# Patient Record
Sex: Male | Born: 1950
Health system: Southern US, Community
[De-identification: ages and names within clinical notes are randomized; demographics above are authoritative.]

## PROBLEM LIST (undated history)

## (undated) DIAGNOSIS — I4949 Other premature depolarization: Secondary | ICD-10-CM

## (undated) DIAGNOSIS — K219 Gastro-esophageal reflux disease without esophagitis: Secondary | ICD-10-CM

## (undated) DIAGNOSIS — M1990 Unspecified osteoarthritis, unspecified site: Secondary | ICD-10-CM

## (undated) DIAGNOSIS — G4700 Insomnia, unspecified: Secondary | ICD-10-CM

## (undated) DIAGNOSIS — E782 Mixed hyperlipidemia: Secondary | ICD-10-CM

## (undated) DIAGNOSIS — N401 Enlarged prostate with lower urinary tract symptoms: Secondary | ICD-10-CM

## (undated) DIAGNOSIS — G3184 Mild cognitive impairment, so stated: Secondary | ICD-10-CM

## (undated) DIAGNOSIS — M159 Polyosteoarthritis, unspecified: Secondary | ICD-10-CM

## (undated) DIAGNOSIS — I499 Cardiac arrhythmia, unspecified: Secondary | ICD-10-CM

## (undated) DIAGNOSIS — I4891 Unspecified atrial fibrillation: Secondary | ICD-10-CM

## (undated) DIAGNOSIS — K760 Fatty (change of) liver, not elsewhere classified: Secondary | ICD-10-CM

## (undated) DIAGNOSIS — E7800 Pure hypercholesterolemia, unspecified: Secondary | ICD-10-CM

## (undated) DIAGNOSIS — K5790 Diverticulosis of intestine, part unspecified, without perforation or abscess without bleeding: Secondary | ICD-10-CM

## (undated) DIAGNOSIS — F411 Generalized anxiety disorder: Secondary | ICD-10-CM

## (undated) DIAGNOSIS — E039 Hypothyroidism, unspecified: Secondary | ICD-10-CM

## (undated) DIAGNOSIS — G8929 Other chronic pain: Secondary | ICD-10-CM

## (undated) DIAGNOSIS — Z9189 Other specified personal risk factors, not elsewhere classified: Secondary | ICD-10-CM

## (undated) DIAGNOSIS — I700 Atherosclerosis of aorta: Secondary | ICD-10-CM

## (undated) DIAGNOSIS — I2510 Atherosclerotic heart disease of native coronary artery without angina pectoris: Secondary | ICD-10-CM

## (undated) HISTORY — DX: Insomnia, unspecified: G47.00

## (undated) HISTORY — PX: ABLATION: SHX5711

## (undated) HISTORY — PX: TRANSTHORACIC ECHOCARDIOGRAM: SHX275

## (undated) HISTORY — DX: Gastro-esophageal reflux disease without esophagitis: K21.9

## (undated) HISTORY — DX: Other specified personal risk factors, not elsewhere classified: Z91.89

## (undated) HISTORY — DX: Polyosteoarthritis, unspecified: M15.9

## (undated) HISTORY — DX: Diverticulosis of intestine, part unspecified, without perforation or abscess without bleeding: K57.90

## (undated) HISTORY — DX: Cardiac arrhythmia, unspecified: I49.9

## (undated) HISTORY — DX: Atherosclerotic heart disease of native coronary artery without angina pectoris: I25.10

## (undated) HISTORY — DX: Fatty (change of) liver, not elsewhere classified: K76.0

## (undated) HISTORY — DX: Mixed hyperlipidemia: E78.2

## (undated) HISTORY — DX: Other chronic pain: G89.29

## (undated) HISTORY — DX: Pure hypercholesterolemia, unspecified: E78.00

## (undated) HISTORY — DX: Other premature depolarization: I49.49

## (undated) HISTORY — DX: Unspecified osteoarthritis, unspecified site: M19.90

## (undated) HISTORY — DX: Mild cognitive impairment of uncertain or unknown etiology: G31.84

## (undated) HISTORY — DX: Generalized anxiety disorder: F41.1

## (undated) HISTORY — DX: Atherosclerosis of aorta: I70.0

## (undated) HISTORY — DX: Hypothyroidism, unspecified: E03.9

## (undated) HISTORY — DX: Unspecified atrial fibrillation: I48.91

## (undated) HISTORY — DX: Benign prostatic hyperplasia with lower urinary tract symptoms: N40.1

## (undated) HISTORY — PX: KNEE ARTHROSCOPY: SUR90

---

## 2000-09-11 HISTORY — PX: INGUINAL HERNIA REPAIR: SUR1180

## 2004-08-03 ENCOUNTER — Ambulatory Visit: Payer: Self-pay | Admitting: Internal Medicine

## 2004-10-06 ENCOUNTER — Ambulatory Visit: Payer: Self-pay

## 2004-10-10 ENCOUNTER — Ambulatory Visit: Payer: Self-pay | Admitting: Internal Medicine

## 2004-11-09 ENCOUNTER — Ambulatory Visit: Payer: Self-pay

## 2004-11-18 ENCOUNTER — Ambulatory Visit: Payer: Self-pay | Admitting: Internal Medicine

## 2004-11-18 ENCOUNTER — Inpatient Hospital Stay (HOSPITAL_BASED_OUTPATIENT_CLINIC_OR_DEPARTMENT_OTHER): Admission: RE | Admit: 2004-11-18 | Discharge: 2004-11-18 | Payer: Self-pay | Admitting: Internal Medicine

## 2004-11-18 HISTORY — PX: CARDIAC CATHETERIZATION: SHX172

## 2004-11-28 ENCOUNTER — Ambulatory Visit: Payer: Self-pay | Admitting: *Deleted

## 2004-12-08 ENCOUNTER — Ambulatory Visit: Payer: Self-pay | Admitting: Internal Medicine

## 2005-02-03 ENCOUNTER — Ambulatory Visit: Payer: Self-pay | Admitting: Internal Medicine

## 2005-07-26 ENCOUNTER — Ambulatory Visit: Payer: Self-pay | Admitting: Internal Medicine

## 2006-04-26 ENCOUNTER — Ambulatory Visit: Payer: Self-pay | Admitting: Internal Medicine

## 2006-11-06 ENCOUNTER — Ambulatory Visit: Payer: Self-pay | Admitting: Internal Medicine

## 2007-04-30 ENCOUNTER — Ambulatory Visit: Payer: Self-pay | Admitting: Internal Medicine

## 2007-11-04 ENCOUNTER — Ambulatory Visit: Payer: Self-pay | Admitting: Internal Medicine

## 2008-02-04 ENCOUNTER — Encounter: Admission: RE | Admit: 2008-02-04 | Discharge: 2008-02-04 | Payer: Self-pay | Admitting: Orthopedic Surgery

## 2008-05-25 ENCOUNTER — Encounter: Admission: RE | Admit: 2008-05-25 | Discharge: 2008-05-25 | Payer: Self-pay | Admitting: *Deleted

## 2008-10-06 ENCOUNTER — Emergency Department (HOSPITAL_BASED_OUTPATIENT_CLINIC_OR_DEPARTMENT_OTHER): Admission: EM | Admit: 2008-10-06 | Discharge: 2008-10-06 | Payer: Self-pay | Admitting: Emergency Medicine

## 2008-11-06 ENCOUNTER — Ambulatory Visit: Payer: Self-pay | Admitting: Internal Medicine

## 2009-01-11 ENCOUNTER — Telehealth: Payer: Self-pay | Admitting: Internal Medicine

## 2009-01-12 ENCOUNTER — Encounter: Admission: RE | Admit: 2009-01-12 | Discharge: 2009-01-12 | Payer: Self-pay | Admitting: Orthopedic Surgery

## 2009-05-14 DIAGNOSIS — Z9189 Other specified personal risk factors, not elsewhere classified: Secondary | ICD-10-CM

## 2009-05-14 DIAGNOSIS — F411 Generalized anxiety disorder: Secondary | ICD-10-CM | POA: Insufficient documentation

## 2009-05-14 DIAGNOSIS — I10 Essential (primary) hypertension: Secondary | ICD-10-CM | POA: Insufficient documentation

## 2009-05-14 DIAGNOSIS — I4891 Unspecified atrial fibrillation: Secondary | ICD-10-CM | POA: Insufficient documentation

## 2009-05-14 DIAGNOSIS — I493 Ventricular premature depolarization: Secondary | ICD-10-CM | POA: Insufficient documentation

## 2009-05-14 DIAGNOSIS — I4949 Other premature depolarization: Secondary | ICD-10-CM

## 2009-05-14 HISTORY — DX: Other specified personal risk factors, not elsewhere classified: Z91.89

## 2009-05-14 HISTORY — DX: Unspecified atrial fibrillation: I48.91

## 2009-05-14 HISTORY — DX: Other premature depolarization: I49.49

## 2009-05-14 HISTORY — DX: Generalized anxiety disorder: F41.1

## 2009-05-18 ENCOUNTER — Ambulatory Visit: Payer: Self-pay | Admitting: Internal Medicine

## 2009-12-07 ENCOUNTER — Ambulatory Visit: Payer: Self-pay | Admitting: Internal Medicine

## 2010-07-19 ENCOUNTER — Ambulatory Visit: Payer: Self-pay | Admitting: Internal Medicine

## 2010-10-11 NOTE — Assessment & Plan Note (Signed)
Summary: 8 month rov/amber   Visit Type:  8 month followup  CC:  no complaints.  History of Present Illness: Mr. Hartsell is seen in followup for atrial fibrillation. He is undergone PVI x2;  He has been doing very well     His anxiety is under better control.    he also has symptomatic PVCs but those are currently under control ; he denies chest pain    Problems Prior to Update: 1)  Sleep Disorder, Hx of  (ICD-V15.89) 2)  Hypertension  (ICD-401.9) 3)  Premature Ventricular Contractions  (ICD-427.69) 4)  Anxiety  (ICD-300.00) 5)  Paroxysmal Atrial Fibrillation  (ICD-427.31)  Current Medications (verified): 1)  Crestor 10 Mg Tabs (Rosuvastatin Calcium) .... Take 1 Tablet By Mouth Once A Day 2)  Alprazolam 1 Mg Tabs (Alprazolam) .Marland Kitchen.. 1 By Mouth Once Daily At Bedtime As Needed 3)  Nortriptyline Hcl 10 Mg Caps (Nortriptyline Hcl) 4)  Aspirin 81 Mg Tbec (Aspirin) .... Take One Tablet By Mouth Daily 5)  Fish Oil 1000 Mg Caps (Omega-3 Fatty Acids) .... Once Daily 6)  Levothyroxine Sodium 50 Mcg Tabs (Levothyroxine Sodium) .... Once Daily 7)  Aspirin 81 Mg Tbec (Aspirin) .... Take One Tablet By Mouth Daily  Allergies (verified): No Known Drug Allergies  Past History:  Past Medical History: Last updated: 05/14/2009 SLEEP DISORDER, HX OF (ICD-V15.89) HYPERTENSION (ICD-401.9) PREMATURE VENTRICULAR CONTRACTIONS (ICD-427.69) ANXIETY (ICD-300.00) PAROXYSMAL ATRIAL FIBRILLATION (ICD-427.31)  Past Surgical History: Last updated: 05/14/2009 Cardiac Cath  11/18/2004  Arvilla Meres, M.D  Family History: Last updated: 05/14/2009 Negative for sudden cardiac death or history of arrhythmias. Otherwise, noncontributory  Social History: Last updated: 05/14/2009 Tobacco Use - No.  Alcohol Use - no Drug Use - no  Risk Factors: Smoking Status: never (05/14/2009)  Vital Signs:  Patient profile:   60 year old male Height:      69 inches Weight:      199.50 pounds BMI:      29.57 Pulse rate:   74 / minute BP sitting:   139 / 85  (left arm) Cuff size:   regular  Vitals Entered By: Caralee Ates CMA (July 19, 2010 9:07 AM)  Physical Exam  General:  The patient was alert and oriented in no acute distress. HEENT Normal.  Neck veins were flat, carotids were brisk.  Lungs were clear.  Heart sounds were regular without murmurs or gallops.  Abdomen was soft with active bowel sounds. There is no clubbing cyanosis or edema. Skin Warm and dry    Impression & Recommendations:  Problem # 1:  PAROXYSMAL ATRIAL FIBRILLATION (ICD-427.31) holidng sinus His updated medication list for this problem includes:    Aspirin 81 Mg Tbec (Aspirin) .Marland Kitchen... Take one tablet by mouth daily  Problem # 2:  HYPERTENSION (ICD-401.9) mildly elevated but will followup with PCP His updated medication list for this problem includes:    Aspirin 81 Mg Tbec (Aspirin) .Marland Kitchen... Take one tablet by mouth daily  Patient Instructions: 1)  Your physician recommends that you continue on your current medications as directed. Please refer to the Current Medication list given to you today. 2)  Your physician wants you to follow-up in: 1 year  You will receive a reminder letter in the mail two months in advance. If you don't receive a letter, please call our office to schedule the follow-up appointment.

## 2010-10-11 NOTE — Assessment & Plan Note (Signed)
Summary: PER CHECK OUT/SF   CC:  FOLLOW UP.  Pt says that most of the time he does pretty well unless he puts himself in a stressful position.  Marland Kitchen  History of Present Illness: Marc Benson is seen in followup for atrial fibrillation. He is undergone PVI x2; he also has symptomatic PVCs but those are currently under control ; he denies chest painarea and his major issue remains anxiety he is shortness of breath is mostly an issue only if he has significant concomitant stress. He is increasingly active socially.  Current Medications (verified): 1)  Crestor 10 Mg Tabs (Rosuvastatin Calcium) .... Take 1 Tablet By Mouth Once A Day 2)  Alprazolam 1 Mg Tabs (Alprazolam) .Marland Kitchen.. 1 By Mouth Once Daily At Bedtime As Needed 3)  Nortriptyline Hcl 10 Mg Caps (Nortriptyline Hcl) 4)  Aspirin 81 Mg Tbec (Aspirin) .... Take One Tablet By Mouth Daily 5)  Fish Oil 1000 Mg Caps (Omega-3 Fatty Acids) .... Once Daily  Allergies (verified): No Known Drug Allergies  Past History:  Past Medical History: Last updated: 05/14/2009 SLEEP DISORDER, HX OF (ICD-V15.89) HYPERTENSION (ICD-401.9) PREMATURE VENTRICULAR CONTRACTIONS (ICD-427.69) ANXIETY (ICD-300.00) PAROXYSMAL ATRIAL FIBRILLATION (ICD-427.31)  Past Surgical History: Last updated: 05/14/2009 Cardiac Cath  11/18/2004  Arvilla Meres, M.D  Family History: Last updated: 05/14/2009 Negative for sudden cardiac death or history of arrhythmias. Otherwise, noncontributory  Social History: Last updated: 05/14/2009 Tobacco Use - No.  Alcohol Use - no Drug Use - no  Vital Signs:  Patient profile:   60 year old male Height:      69 inches Weight:      201 pounds BMI:     29.79 Pulse rate:   82 / minute Pulse rhythm:   regular BP sitting:   122 / 78  (left arm) Cuff size:   large  Vitals Entered By: Marc [facility] CMA (December 07, 2009 2:09 PM)  Physical Exam  General:  The patient was alert and oriented in no acute distress. HEENT Normal.  Neck  veins were flat, carotids were brisk.  Lungs were clear.  Heart sounds were regular without murmurs or gallops.  Abdomen was soft with active bowel sounds. There is no clubbing cyanosis or edema. Skin Warm and dry    EKG  Procedure date:  12/07/2009  Findings:      sinus rhythm at 82 Intervals 0.17/0.10/0.39 Axis LXV otherwise normal  Impression & Recommendations:  Problem # 1:  PAROXYSMAL ATRIAL FIBRILLATION (ICD-427.31) holding sinus rhythm followin Pulmoonary vein isolation x2 His updated medication list for this problem includes:    Aspirin 81 Mg Tbec (Aspirin) .Marland Kitchen... Take one tablet by mouth daily  Problem # 2:  PREMATURE VENTRICULAR CONTRACTIONS (ICD-427.69) ongoing but not too disruptive His updated medication list for this problem includes:    Aspirin 81 Mg Tbec (Aspirin) .Marland Kitchen... Take one tablet by mouth daily  Orders: EKG w/ Interpretation (93000)

## 2010-10-21 ENCOUNTER — Telehealth: Payer: Self-pay | Admitting: Internal Medicine

## 2010-10-27 NOTE — Progress Notes (Signed)
Summary: pt needs to go off aspirin needs ok faxed today if poss  Pat  Phone Note From Other Clinic   Caller: Zollie Scale [phone] Summary of Call: pt having knee surgery and she needs to go off aspirin 5 days prior-pls fax to [phone] really needs today if possible Initial call taken by: Glynda Jaeger,  October 21, 2010 9:12 AM  Follow-up for Phone Call        OK to stop Asa 5 days prior to knee surgery per Dr.Brittany Osier.  Layne Benton, RN, BSN  October 21, 2010 10:36 AM Message left for [place] at above phone number that information would be faxed. This note then faxed to above fax number Follow-up by: Dossie Arbour, RN, BSN,  October 21, 2010 10:56 AM

## 2010-11-07 ENCOUNTER — Ambulatory Visit: Payer: BC Managed Care – PPO | Attending: Orthopedic Surgery | Admitting: Physical Therapy

## 2010-11-07 DIAGNOSIS — M25669 Stiffness of unspecified knee, not elsewhere classified: Secondary | ICD-10-CM | POA: Insufficient documentation

## 2010-11-07 DIAGNOSIS — M25569 Pain in unspecified knee: Secondary | ICD-10-CM | POA: Insufficient documentation

## 2010-11-07 DIAGNOSIS — IMO0001 Reserved for inherently not codable concepts without codable children: Secondary | ICD-10-CM | POA: Insufficient documentation

## 2010-11-07 DIAGNOSIS — R262 Difficulty in walking, not elsewhere classified: Secondary | ICD-10-CM | POA: Insufficient documentation

## 2011-01-24 NOTE — Assessment & Plan Note (Signed)
[facility]                         ELECTROPHYSIOLOGY OFFICE NOTE   CORDAE, MCCAREY                         MRN:          [id number]  DATE:11/06/2008                            DOB:          1950-09-16    [name] was seen at the referral of the emergency room where he  presented with palpitations, which turned out to be PVCs.  He has been  under a great deal of stress.  He thought it may be with an atrial  fibrillation, as he had had 2 pulmonary vein isolation procedures most  recently on November 7.  However, apparently the monitoring do showed  PVCs.   He has noted that he has put on a good deal of weight about 25 pounds  over the last year and half or so.  This is concurrent with him having  had Achilles tendon surgery and inability to exercise.  Concurrent with  this, there has been increasing problems with snoring, so that his wife  has been complaining of it waking him up.   MEDICATIONS:  1. Alprazolam.  2. Levothyroxine 50.  3. Nortriptyline 10.  4. Crestor 10.  5. Aspirin 81.   PHYSICAL EXAMINATION:  VITAL SIGNS:  His blood pressure was 136/90 and  his pulse was 89.  NECK:  Veins were 6 cm.  LUNGS:  Clear.  HEART:  Sounds were regular.  ABDOMEN:  Soft.  EXTREMITIES:  No edema.   Electrocardiogram dated today demonstrated sinus rhythm at 88 with  intervals of 0.15/0.10/0.37.  The axis was 55 degrees.   IMPRESSION:  1. Premature ventricular contractions.  2. Atrial fibrillation status post pulmonary vein isolation x2.  3. Hypertension.  4. Sleep-disordered breathing.   We will plan to, at this point, not undertake any medical therapy.  We  will undertake a NightWatch sleep monitors to assess for sleep apnea and  then if this is a problem, we will refer  him for further evaluation.  If this is positive treatment, it may also  have an impact on his hypertension and we are going to avoid further  medicinal therapy.     Duke Salvia, MD, [facility]  Electronically Signed    SCK/MedQ  DD: 11/06/2008  DT: 11/07/2008  Job #: [id number]   cc:   Molly Maduro L. Foy Guadalajara, M.D.

## 2011-01-24 NOTE — Assessment & Plan Note (Signed)
[facility]                         ELECTROPHYSIOLOGY OFFICE NOTE   [name], [name]                         MRN:          [id number]  DATE:04/30/2007                            DOB:          10-12-1950    [name] is seen after having had a second atrial fibrillation  ablation done by Dr. Sampson Goon last December or so.  He had a recurrent  episode of atrial fibrillation just a couple of weeks ago and Dr.  Sampson Goon would like to keep him on Coumadin for the time being and  will plan to see him again in three months' time.   I, at this point, have nothing to add.  I am glad to hear that he has  been back at work more.   His blood pressure is better controlled at this point at 110/82, his  pulse was 80.  His lungs were clear.  Heart sounds were regular.  His  weight was up a little bit at 193 pounds, which is up ten pounds in the  last year.   We will plan to see him again in six months' time.     Duke Salvia, MD, [facility]  Electronically Signed    SCK/MedQ  DD: 04/30/2007  DT: 05/01/2007  Job #: [id number]

## 2011-01-24 NOTE — Assessment & Plan Note (Signed)
[facility]                         ELECTROPHYSIOLOGY OFFICE NOTE   COLM, LYFORD                         MRN:          [id number]  DATE:11/04/2007                            DOB:          04/28/1951    Marc Benson comes in in follow-up for his atrial arrhythmias, status post  two ablation procedures at [facility].  He is having recurrent palpitations  and a Holter monitor was obtained demonstrating PVCs, PACs and short  runs of an atrial tachycardia.  He remains on Coumadin.   He is undergoing insurance changes, and he would like to consolidate his  cardiology care.  He would like to have that done here as it is simpler  than following up with [facility], at least in the absence of needing  another procedure.  I have told him I would be glad to do this.   He continues to have significant and debilitating anxiety related to his  palpitations.  I have suggested that he talk to Dr. Foy Guadalajara concerning  biofeedback and seeing if that does not help some.   On examination today, his vital signs were normal.  On examination today  his blood pressure was 124/86, his pulse was 74.  LUNGS:  Clear.  Heart sounds were regular.  EXTREMITIES:  Without edema.   His electrocardiogram demonstrated sinus rhythm with PVCs.   IMPRESSION:  1. Paroxysmal atrial fibrillation, status post ablation procedures x2.  2. Episodes of nonsustained intercurrent atrial fibrillation.  3. CHADS-2 score of 0.  4. Anxiety.  5. PVCs.   Mr. Caban is not having recurrent atrial fibrillation.  We will plan to  discontinue his Coumadin.   I have given him a prescription for Inderal 10 mg, and we will see him  again in six months' time.   He asked that I fill out his disability forms, and I did as outlined  previously.     Duke Salvia, MD, [facility]  Electronically Signed    SCK/MedQ  DD: 11/04/2007  DT: 11/05/2007  Job #: [id number]

## 2011-01-27 NOTE — Letter (Signed)
April 26, 2006     [name] Guadalajara, MD  [address]  [place], [place] [address]   RE:  CHIEF, [name]  MRN:  [id number]  /  DOB:  1951-08-18   Dear Marc Benson:   Marc [facility] came in today.  He is status post atrial fibrillation ablation  undertaken at [facility].  As you know, he has had problems with some atrial  fibrillation thereafter, but this is pretty common following ablation  procedure, and there is some time frame somewhere between three and six  months, wherein recurrent atrial fibrillation is accepted with still a  likelihood of long-term cure.   Marc Benson was singing your praises in terms of your medicinal help with his  anxiety.  This remains a debilitating issue.   He is supposed to see Dr. Sampson Goon next week.  I have asked him to review  with Dr. Sampson Goon whether he needs to be on Coumadin.  His [place] score is  0.  I think at this point, unless there is a strong indication otherwise, I  would be in favor of stopping his Coumadin.   He struggles with his Pamelor at 60; he likes it at 10.  I showed him how to  open up his capsules and get rid of half of it.  He may try that.   Will plan to see him again in six months' time.  Please let me know if there  is anything I can do in the interim.    Sincerely,      Marc Salvia, MD, [facility]   SCK/MedQ  DD:  04/26/2006  DT:  04/26/2006  Job #:  [id number]   CC:    Clydie Braun, M.D.

## 2011-01-27 NOTE — Assessment & Plan Note (Signed)
[facility]                         ELECTROPHYSIOLOGY OFFICE NOTE   Marc [facility], Marc Benson                         MRN:          [id number]  DATE:11/06/2006                            DOB:          10/25/1950    [name] is seen.  He is status post a redo PVI undertaken in  November, 2007 by Dr. Sampson Benson.  At that point, all four veins were re-  isolated.  The patient has now gone two months without recurrent  symptoms.  He comes in today with a disability form.  He is currently on  Coumadin, Crestor, and is taking no antiarrhythmics.   PHYSICAL EXAMINATION:  VITAL SIGNS:  His blood pressure is quite  elevated at 146/98.  His pulse was 95.  He does note, however, that it  was very stressful getting here in the rain.  LUNGS:  Clear.  CARDIAC:  Heart sounds were regular.  EXTREMITIES:  Without edema.   Electrocardiogram dated today demonstrated a sinus rhythm of 79 with  intervals of 0.15/0.1/0.39.   IMPRESSION:  1. Paroxysmal atrial fibrillation.  2. Hypertension today but may just be white coat.  3. Anxiety disorder.   I filled out his disability form.  We will plan to see him again in six  months.  He will be seeing Dr. Sampson Benson in about three months.     Marc Salvia, MD, [facility]  Electronically Signed    SCK/MedQ  DD: 11/06/2006  DT: 11/06/2006  Job #: [id number]   cc:   Marc Benson, M.D.  Dr. Sampson Benson at [facility]

## 2011-01-27 NOTE — Cardiovascular Report (Signed)
NAMEZACKERY, BRINE                ACCOUNT NO.:  [id number]   MEDICAL RECORD NO.:  [id number]          PATIENT TYPE:  OIB   LOCATION:  6501                         FACILITY:  [facility]   PHYSICIAN:  Arvilla Meres, M.D. LHCDATE OF BIRTH:  February 13, 1951   DATE OF PROCEDURE:  11/18/2004  DATE OF DISCHARGE:                              CARDIAC CATHETERIZATION   PRIMARY CARE PHYSICIAN:  [name] Guadalajara, M.D.  Cardiologist, Dr. Duke Salvia.  He also has a cardiologist, Dr. Shirlean Kelly, in [place].   INDICATIONS FOR PROCEDURE:  Mr. Abreu is a very pleasant, 60 year old male  with paroxysmal atrial fibrillation and hyperlipidemia who has had recurrent  chest pain for several years and is now referred for cardiac  catheterization.   PROCEDURES:  1.  Selective coronary angiography.  2.  Left heart catheterization.  3.  Left ventriculogram.   CATHETERS USED:  1.  4 French JL5.  2.  4 [place] JR4.  3.  4 French bent pigtail.   DESCRIPTION OF PROCEDURE:  The risks and benefits of catheterization were  explained to Mr. Scoggins.  Consent was signed and placed on the chart.  The  right groin area was prepped and draped in routine sterile fashion and then  anesthetized with 1% local lidocaine.  A 4 French sheath was placed in the  right femoral artery using modified Seldinger technique.  The catheters  listed above were used for the procedure.  All catheter exchanges were made  over wire.  There are no apparent complications.  At the end of the  procedure, the patient was transferred to the holding room for removal of  his vascular access.   FINDINGS:  1.  Central aortic pressure was 120/75 with a mean of 96.  2.  LV pressure was 122/4 with an EDP of 10.  There is no gradient of aortic      valve wall pullback.  3.  The left main was long and normal.  LAD was a long vessel wrapping the      apex.  It gave off two large branching diagonals.  There was no      angiographic CAD.  4.   Left circumflex was made up of tiny OM-1 and normal size OM-2.  The AV      groove circumflex was small.  There was no angiographic CAD.  5.  Right coronary artery was a dominant vessel.  It gave off a large PDA as      well as a large acute marginal which also supplied some of the PDA      territory.  There was a minor irregularity in the mid distal vessel, but      otherwise, no angiographic CAD.  6.  Left ventriculogram done in the RAO approach showed estimated ejection      fraction of about 60%.  There was no wall motion abnormalities or mitral      regurgitation.  Panning down over the abdominal aorta showed some mild      aortic plaquing, but no evidence of aneurysm.  The renal arteries  were      not visualized given the projection.   ASSESSMENT/PLAN:  1.  Normal coronary arteries.  2.  Normal left ventricular function with no evidence of significant mitral      regurgitation.  3.  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  4.  Would continue risk factor management.      DB/MEDQ  D:  11/18/2004  T:  11/18/2004  Job:  [id number]   cc:   Molly Maduro L. Foy Guadalajara, M.D.  [address] [place]  [place] [address]  Fax: [phone]   Duke Salvia, M.D.   Shirlean Kelly, M.D.  [place], [place]

## 2011-07-17 ENCOUNTER — Ambulatory Visit: Payer: BC Managed Care – PPO | Admitting: Internal Medicine

## 2011-09-19 ENCOUNTER — Encounter: Payer: Self-pay | Admitting: *Deleted

## 2011-09-19 ENCOUNTER — Encounter: Payer: Self-pay | Admitting: Internal Medicine

## 2011-09-19 ENCOUNTER — Ambulatory Visit (INDEPENDENT_AMBULATORY_CARE_PROVIDER_SITE_OTHER): Payer: BC Managed Care – PPO | Admitting: Internal Medicine

## 2011-09-19 DIAGNOSIS — I4949 Other premature depolarization: Secondary | ICD-10-CM

## 2011-09-19 DIAGNOSIS — I4891 Unspecified atrial fibrillation: Secondary | ICD-10-CM

## 2011-09-19 NOTE — Assessment & Plan Note (Signed)
Currently quiet.

## 2011-09-19 NOTE — Progress Notes (Signed)
  HPI  Marc [facility] is a 61 y.o. male Marc [facility] in followup for paroxysmal atrial fibrillation. He has undergone pulmonary vein isolation.  The patient denies chest pain, shortness of breath, nocturnal dyspnea, orthopnea or peripheral edema.  There have been no palpitations, lightheadedness or syncope.   He continues to struggle with anxiety Past Medical History  Diagnosis Date  . HYPERTENSION 05/14/2009  . PAROXYSMAL ATRIAL FIBRILLATION 05/14/2009  . PREMATURE VENTRICULAR CONTRACTIONS 05/14/2009  . ANXIETY 05/14/2009  . SLEEP DISORDER, HX OF 05/14/2009    Past Surgical History  Procedure Date  . Cardiac catheterization 11/18/2004      Current Outpatient Prescriptions  Medication Sig Dispense Refill  . ALPRAZolam (XANAX) 1 MG tablet Take 1 mg by mouth at bedtime as needed.       Marland Kitchen aspirin 81 MG tablet Take 160 mg by mouth daily.        . CRESTOR 10 MG tablet Take 10 mg by mouth daily.       . cyanocobalamin 100 MCG tablet Take 100 mcg by mouth daily.        Marland Kitchen levothyroxine (SYNTHROID, LEVOTHROID) 50 MCG tablet Take 50 mcg by mouth daily.       . nortriptyline (PAMELOR) 10 MG capsule Take 10 mg by mouth at bedtime.       . Omega-3 Fatty Acids (FISH OIL) 1000 MG CAPS Take 1 capsule by mouth daily.          Allergies  Allergen Reactions  . Benadryl (Diphenhydramine Hcl)     Review of Systems negative except from HPI and PMH  Physical Exam BP 122/84  Pulse 68  Ht 5\' 9"  (1.753 m)  Wt 198 lb 1.9 oz (89.867 kg)  BMI 29.26 kg/m2 Well developed and well nourished in no acute distress HENT normal E scleral and icterus clear Neck Supple JVP flat; carotids brisk and full Clear to ausculation  Regular rate and rhythm, no murmurs gallops or rub Soft with active bowel sounds No clubbing cyanosis none Edema Alert and oriented, grossly normal motor and sensory function Skin Warm and Dry  sItus rhythm at 68 Intervals 0.16/0.10/0.40 Axis is 68 Assessment and  Plan

## 2011-09-19 NOTE — Progress Notes (Signed)
Addended by: Sherri Rad C on: 09/19/2011 10:31 AM   Modules accepted: Orders

## 2011-09-19 NOTE — Assessment & Plan Note (Signed)
He has a CHADS-VASc score of 1-0 as he has a history of but is not treated for hypertension and his blood pressure is normal. Hence based on recent guidelines, we will discontinue his aspirin

## 2011-09-19 NOTE — Patient Instructions (Signed)
Your physician has recommended you make the following change in your medication:  1) Stop Aspirin.  Your physician wants you to follow-up in: 1 year with Dr. Klein. You will receive a reminder letter in the mail two months in advance. If you don't receive a letter, please call our office to schedule the follow-up appointment.  

## 2012-10-17 ENCOUNTER — Ambulatory Visit: Payer: BC Managed Care – PPO | Admitting: Internal Medicine

## 2012-11-05 ENCOUNTER — Ambulatory Visit: Payer: BC Managed Care – PPO | Admitting: Internal Medicine

## 2012-11-13 ENCOUNTER — Encounter: Payer: Self-pay | Admitting: Internal Medicine

## 2012-11-13 ENCOUNTER — Ambulatory Visit (INDEPENDENT_AMBULATORY_CARE_PROVIDER_SITE_OTHER): Payer: BC Managed Care – PPO | Admitting: Internal Medicine

## 2012-11-13 VITALS — BP 112/74 | HR 69 | Ht 69.0 in | Wt 187.4 lb

## 2012-11-13 DIAGNOSIS — I4891 Unspecified atrial fibrillation: Secondary | ICD-10-CM

## 2012-11-13 NOTE — Patient Instructions (Addendum)
Your physician wants you to follow-up in: 12 months with Dr. Klein. You will receive a reminder letter in the mail two months in advance. If you don't receive a letter, please call our office to schedule the follow-up appointment.  

## 2012-11-13 NOTE — Progress Notes (Signed)
  HPI  Marc Benson is a 62 y.o. male Sen in followup for paroxysmal atrial fibrillation. He has undergone pulmonary vein isolation.  The patient denies chest pain, shortness of breath, nocturnal dyspnea, orthopnea or peripheral edema.  There have been no palpitations, lightheadedness or syncope.   He continues to struggle with anxiety.    We had lengthy discussion re social justice issue.  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 HYPERTENSION 05/14/2009  . PAROXYSMAL ATRIAL FIBRILLATION 05/14/2009    s/p PVI x 2 [facility]  . PREMATURE VENTRICULAR CONTRACTIONS 05/14/2009  . ANXIETY 05/14/2009  . SLEEP DISORDER, HX OF 05/14/2009    Past Surgical History  Procedure Laterality Date  . Cardiac catheterization  11/18/2004      Current Outpatient Prescriptions  Medication Sig Dispense Refill  . ALPRAZolam (XANAX) 1 MG tablet Take 1 mg by mouth at bedtime as needed.       . CRESTOR 10 MG tablet Take 10 mg by mouth daily.       Marland Kitchen levothyroxine (SYNTHROID, LEVOTHROID) 50 MCG tablet Take 50 mcg by mouth daily.       . nortriptyline (PAMELOR) 10 MG capsule Take 10 mg by mouth at bedtime.       . cyanocobalamin 100 MCG tablet Take 100 mcg by mouth daily.        . Omega-3 Fatty Acids (FISH OIL) 1000 MG CAPS Take 1 capsule by mouth daily.         No current facility-administered medications for this visit.    Allergies  Allergen Reactions  . Benadryl (Diphenhydramine Hcl)     Review of Systems negative except from HPI and PMH  Physical Exam BP 112/74  Pulse 69  Ht 5\' 9"  (1.753 m)  Wt 187 lb 6.4 oz (85.004 kg)  BMI 27.66 kg/m2 Well developed and well nourished in no acute distress HENT normal E scleral and icterus clear Neck Supple JVP flat; carotids brisk and full Clear to ausculation  Regular rate and rhythm, no murmurs gallops or rub Soft with active bowel sounds No clubbing cyanosis none Edema Alert and  oriented, grossly normal motor and sensory function Skin Warm and Dry  ECG sinus 75 16/11/39/ +80

## 2012-11-13 NOTE — Assessment & Plan Note (Signed)
Stable

## 2013-09-11 HISTORY — PX: OTHER SURGICAL HISTORY: SHX169

## 2013-10-24 ENCOUNTER — Encounter: Payer: Self-pay | Admitting: Internal Medicine

## 2013-12-18 ENCOUNTER — Ambulatory Visit: Payer: BC Managed Care – PPO | Admitting: Internal Medicine

## 2013-12-22 ENCOUNTER — Ambulatory Visit (INDEPENDENT_AMBULATORY_CARE_PROVIDER_SITE_OTHER): Payer: BC Managed Care – PPO | Admitting: Internal Medicine

## 2013-12-22 ENCOUNTER — Encounter: Payer: Self-pay | Admitting: Internal Medicine

## 2013-12-22 VITALS — BP 124/76 | HR 76 | Ht 69.0 in | Wt 185.2 lb

## 2013-12-22 DIAGNOSIS — I4891 Unspecified atrial fibrillation: Secondary | ICD-10-CM

## 2013-12-22 DIAGNOSIS — I480 Paroxysmal atrial fibrillation: Secondary | ICD-10-CM

## 2013-12-22 NOTE — Patient Instructions (Signed)
Your physician recommends that you continue on your current medications as directed. Please refer to the Current Medication list given to you today.  Your physician wants you to follow-up in: 1 year with Dr. Klein.  You will receive a reminder letter in the mail two months in advance. If you don't receive a letter, please call our office to schedule the follow-up appointment.  

## 2013-12-22 NOTE — Progress Notes (Signed)
      Patient Care Team: Abigail Miyamoto, MD as PCP - General (Family Medicine)   HPI  Marc Benson is a 63 y.o. male Seen in followup for paroxysmal atrial fibrillation. He has undergone pulmonary vein isolation.  The patient denies chest pain, shortness of breath, nocturnal dyspnea, orthopnea or peripheral edema. There have been no palpitations, lightheadedness or syncope.  He continues to struggle with anxiety.   He has had a few episodes of atrial fibrillation. He has tolerated them quite well from a symptom point of view and an anxiety point of view. They have been short lived.  His CHADS-VASc score is 0  Past Medical History  Diagnosis Date  . HYPERTENSION 05/14/2009  . PAROXYSMAL ATRIAL FIBRILLATION 05/14/2009    s/p PVI x 2 [facility]  . PREMATURE VENTRICULAR CONTRACTIONS 05/14/2009  . ANXIETY 05/14/2009  . SLEEP DISORDER, HX OF 05/14/2009    Past Surgical History  Procedure Laterality Date  . Cardiac catheterization  11/18/2004      Current Outpatient Prescriptions  Medication Sig Dispense Refill  . ALPRAZolam (XANAX) 1 MG tablet Take 1 mg by mouth at bedtime as needed.       Marland Kitchen levothyroxine (SYNTHROID, LEVOTHROID) 50 MCG tablet Take 50 mcg by mouth daily.       . nortriptyline (PAMELOR) 10 MG capsule Take 10 mg by mouth at bedtime.       . rosuvastatin (CRESTOR) 10 MG tablet Take 10 mg by mouth daily.       No current facility-administered medications for this visit.    Allergies  Allergen Reactions  . Benadryl [Diphenhydramine Hcl]     Review of Systems negative except from HPI and PMH  Physical Exam BP 124/76  Pulse 76  Ht 5\' 9"  (1.753 m)  Wt 185 lb 3.2 oz (84.006 kg)  BMI 27.34 kg/m2 Well developed and nourished in no acute distress HENT normal Neck supple with JVP-flat Clear Regular rate and rhythm, no murmurs or gallops Abd-soft with active BS No Clubbing cyanosis edema Skin-warm and dry A & Oriented  Grossly normal sensory and motor function   Dry  ECG demonstrates sinus rhythm with frequent PACs. Intervals 15/10/38 Otherwise normal  Assessment and  Plan  Atrial fibrillation s/p PVI x2  PACs   anxiety He has had a few episodes of atrial fibrillation. He has tolerated surprisingly well. Is concern is for anticoagulation. With a CHADS-VASc score of 0 at risk would be estimated at less than 1% per year and anticoagulation even with aspirin is not recommended. He was very comforted by this.

## 2014-09-02 ENCOUNTER — Other Ambulatory Visit: Payer: Self-pay | Admitting: Orthopedic Surgery

## 2014-09-02 DIAGNOSIS — M25512 Pain in left shoulder: Secondary | ICD-10-CM

## 2014-09-08 ENCOUNTER — Ambulatory Visit
Admission: RE | Admit: 2014-09-08 | Discharge: 2014-09-08 | Disposition: A | Discharge status: Home / Self Care | Payer: BC Managed Care – PPO | Source: Ambulatory Visit | Attending: Orthopedic Surgery | Admitting: Orthopedic Surgery

## 2014-09-08 DIAGNOSIS — M25512 Pain in left shoulder: Secondary | ICD-10-CM

## 2014-09-30 HISTORY — PX: ROTATOR CUFF REPAIR: SHX139

## 2014-11-12 ENCOUNTER — Encounter: Payer: Self-pay | Admitting: Internal Medicine

## 2014-12-31 ENCOUNTER — Ambulatory Visit (INDEPENDENT_AMBULATORY_CARE_PROVIDER_SITE_OTHER): Payer: BLUE CROSS/BLUE SHIELD | Admitting: Internal Medicine

## 2014-12-31 ENCOUNTER — Encounter: Payer: Self-pay | Admitting: Internal Medicine

## 2014-12-31 VITALS — BP 124/78 | HR 68 | Ht 68.0 in | Wt 185.4 lb

## 2014-12-31 DIAGNOSIS — I480 Paroxysmal atrial fibrillation: Secondary | ICD-10-CM

## 2014-12-31 NOTE — Progress Notes (Signed)
      Patient Care Team: Briscoe Deutscher, MD as PCP - General (Family Medicine)   HPI  Marc Benson is a 64 y.o. male Seen in followup for paroxysmal atrial fibrillation. He has undergone pulmonary vein isolation.  The patient denies chest pain, shortness of breath, nocturnal dyspnea, orthopnea or peripheral edema. There have been no palpitations, lightheadedness or syncope.  He continues to struggle with anxiety. This is somewhat better.  He has had no significant atrial fibrillation of which  he is aware.  His CHADS-VASc score is 0  Past Medical History  Diagnosis Date  . HYPERTENSION 05/14/2009  . PAROXYSMAL ATRIAL FIBRILLATION 05/14/2009    s/p PVI x 2 [facility]  . PREMATURE VENTRICULAR CONTRACTIONS 05/14/2009  . ANXIETY 05/14/2009  . SLEEP DISORDER, HX OF 05/14/2009    Past Surgical History  Procedure Laterality Date  . Cardiac catheterization  11/18/2004      Current Outpatient Prescriptions  Medication Sig Dispense Refill  . ALPRAZolam (XANAX) 1 MG tablet Take 1 mg by mouth at bedtime as needed for anxiety or sleep.     Marland Kitchen levothyroxine (SYNTHROID, LEVOTHROID) 50 MCG tablet Take 50 mcg by mouth daily.     . nortriptyline (PAMELOR) 10 MG capsule Take 10 mg by mouth at bedtime.     . rosuvastatin (CRESTOR) 10 MG tablet Take 10 mg by mouth daily.     No current facility-administered medications for this visit.    Allergies  Allergen Reactions  . Benadryl [Diphenhydramine Hcl]     Review of Systems negative except from HPI and PMH  Physical Exam BP 124/78 mmHg  Pulse 68  Ht 5\' 8"  (1.727 m)  Wt 185 lb 6.4 oz (84.097 kg)  BMI 28.20 kg/m2 Well developed and nourished in no acute distress HENT normal Neck supple with JVP-flat Clear Regular rate and rhythm, no murmurs or gallops Abd-soft with active BS No Clubbing cyanosis edema Skin-warm and dry A & Oriented  Grossly normal sensory and motor function  Dry  ECG demonstrates sinus rhythm with frequent PACs. Intervals  15/10/38 Otherwise normal  Assessment and  Plan  Atrial fibrillation s/p PVI x2  PACs   anxiety   He has had no interval atrial fibrillation which  he is aware. He was told that he was having atrial fibrillation when he had his rotator cuff surgery. He was unaware of this. We do not have the ECG.

## 2014-12-31 NOTE — Patient Instructions (Signed)
Medication Instructions:  none  Labwork: none  Testing/Procedures: none  Follow-Up: Your physician wants you to follow-up in: 6 months with Dr. Gari Crown will receive a reminder letter in the mail two months in advance. If you don't receive a letter, please call our office to schedule the follow-up appointment.   Any Other Special Instructions Will Be Listed Below (If Applicable).  none

## 2015-04-20 DIAGNOSIS — Z23 Encounter for immunization: Secondary | ICD-10-CM | POA: Diagnosis not present

## 2015-04-20 DIAGNOSIS — I480 Paroxysmal atrial fibrillation: Secondary | ICD-10-CM | POA: Diagnosis not present

## 2015-04-20 DIAGNOSIS — Z79899 Other long term (current) drug therapy: Secondary | ICD-10-CM | POA: Diagnosis not present

## 2015-04-20 DIAGNOSIS — Z87891 Personal history of nicotine dependence: Secondary | ICD-10-CM | POA: Diagnosis not present

## 2015-04-20 DIAGNOSIS — E039 Hypothyroidism, unspecified: Secondary | ICD-10-CM | POA: Diagnosis not present

## 2015-04-20 DIAGNOSIS — Z1211 Encounter for screening for malignant neoplasm of colon: Secondary | ICD-10-CM | POA: Diagnosis not present

## 2015-04-20 DIAGNOSIS — Z0000 Encounter for general adult medical examination without abnormal findings: Secondary | ICD-10-CM | POA: Diagnosis not present

## 2015-04-20 DIAGNOSIS — F419 Anxiety disorder, unspecified: Secondary | ICD-10-CM | POA: Diagnosis not present

## 2015-04-20 DIAGNOSIS — E782 Mixed hyperlipidemia: Secondary | ICD-10-CM | POA: Diagnosis not present

## 2015-04-26 ENCOUNTER — Other Ambulatory Visit: Payer: Self-pay | Admitting: Family Medicine

## 2015-04-26 DIAGNOSIS — Z139 Encounter for screening, unspecified: Secondary | ICD-10-CM

## 2015-04-28 ENCOUNTER — Ambulatory Visit
Admission: RE | Admit: 2015-04-28 | Discharge: 2015-04-28 | Disposition: A | Discharge status: Home / Self Care | Payer: Medicare Other | Source: Ambulatory Visit | Attending: Family Medicine | Admitting: Family Medicine

## 2015-04-28 DIAGNOSIS — Z139 Encounter for screening, unspecified: Secondary | ICD-10-CM

## 2015-04-28 DIAGNOSIS — Z136 Encounter for screening for cardiovascular disorders: Secondary | ICD-10-CM | POA: Diagnosis not present

## 2015-04-28 DIAGNOSIS — I700 Atherosclerosis of aorta: Secondary | ICD-10-CM | POA: Diagnosis not present

## 2015-04-29 DIAGNOSIS — H01009 Unspecified blepharitis unspecified eye, unspecified eyelid: Secondary | ICD-10-CM | POA: Diagnosis not present

## 2015-04-29 DIAGNOSIS — H0289 Other specified disorders of eyelid: Secondary | ICD-10-CM | POA: Diagnosis not present

## 2015-04-29 DIAGNOSIS — H53453 Other localized visual field defect, bilateral: Secondary | ICD-10-CM | POA: Diagnosis not present

## 2015-04-29 DIAGNOSIS — H02831 Dermatochalasis of right upper eyelid: Secondary | ICD-10-CM | POA: Diagnosis not present

## 2015-06-01 DIAGNOSIS — Z23 Encounter for immunization: Secondary | ICD-10-CM | POA: Diagnosis not present

## 2015-06-23 DIAGNOSIS — M7731 Calcaneal spur, right foot: Secondary | ICD-10-CM | POA: Diagnosis not present

## 2015-06-23 DIAGNOSIS — M79671 Pain in right foot: Secondary | ICD-10-CM | POA: Diagnosis not present

## 2015-07-19 ENCOUNTER — Encounter: Payer: Self-pay | Admitting: Internal Medicine

## 2015-07-19 ENCOUNTER — Ambulatory Visit (INDEPENDENT_AMBULATORY_CARE_PROVIDER_SITE_OTHER): Payer: Medicare Other | Admitting: Internal Medicine

## 2015-07-19 VITALS — BP 124/80 | HR 72 | Ht 68.0 in | Wt 191.4 lb

## 2015-07-19 DIAGNOSIS — I493 Ventricular premature depolarization: Secondary | ICD-10-CM

## 2015-07-19 DIAGNOSIS — I480 Paroxysmal atrial fibrillation: Secondary | ICD-10-CM

## 2015-07-19 NOTE — Patient Instructions (Signed)
Medication Instructions: - no changes  Labwork: - none  Procedures/Testing: - none  Follow-Up: - Your physician wants you to follow-up in: 1 year with Dr. Klein You will receive a reminder letter in the mail two months in advance. If you don't receive a letter, please call our office to schedule the follow-up appointment.  Any Additional Special Instructions Will Be Listed Below (If Applicable).   

## 2015-07-19 NOTE — Progress Notes (Signed)
      Patient Care Team: Briscoe Deutscher, MD as PCP - General (Family Medicine)   HPI  Marc Benson is a 64 y.o. male Seen in followup for paroxysmal atrial fibrillation. He has undergone pulmonary vein isolation.  The patient denies chest pain, shortness of breath, nocturnal dyspnea, orthopnea or peripheral edema. There have been no palpitations, lightheadedness or syncope.  He continues to struggle with anxiety. This is somewhat better.  He has had no significant atrial fibrillation of which  he is aware.  His CHADS-VASc score is 0  Past Medical History  Diagnosis Date  . HYPERTENSION 05/14/2009  . PAROXYSMAL ATRIAL FIBRILLATION 05/14/2009    s/p PVI x 2 [facility]  . PREMATURE VENTRICULAR CONTRACTIONS 05/14/2009  . ANXIETY 05/14/2009  . SLEEP DISORDER, HX OF 05/14/2009    Past Surgical History  Procedure Laterality Date  . Cardiac catheterization  11/18/2004      Current Outpatient Prescriptions  Medication Sig Dispense Refill  . ALPRAZolam (XANAX) 1 MG tablet Take 1 mg by mouth at bedtime as needed for anxiety or sleep.     Marland Kitchen levothyroxine (SYNTHROID, LEVOTHROID) 50 MCG tablet Take 50 mcg by mouth daily.     . nortriptyline (PAMELOR) 10 MG capsule Take 10 mg by mouth at bedtime.     . rosuvastatin (CRESTOR) 10 MG tablet Take 10 mg by mouth daily.     No current facility-administered medications for this visit.    Allergies  Allergen Reactions  . Benadryl [Diphenhydramine Hcl]     Review of Systems negative except from HPI and PMH  Physical Exam There were no vitals taken for this visit. Well developed and nourished in no acute distress HENT normal Neck supple with JVP-flat Clear Regular rate and rhythm, no murmurs or gallops Abd-soft with active BS No Clubbing cyanosis edema Skin-warm and dry A & Oriented  Grossly normal sensory and motor function  Dry  ECG demonstrates sinus rhythm @ 72 Intervals 16/10/39 Axis 90 Otherwise normal  Assessment and   Plan  Atrial fibrillation s/p PVI x2  PACs   anxiety   palpiations quiet   Have encouraged him to pursue alternative strategies for the management of his anxiety

## 2015-08-13 DIAGNOSIS — E785 Hyperlipidemia, unspecified: Secondary | ICD-10-CM | POA: Diagnosis not present

## 2015-08-13 DIAGNOSIS — H53453 Other localized visual field defect, bilateral: Secondary | ICD-10-CM | POA: Diagnosis not present

## 2015-08-13 DIAGNOSIS — Z888 Allergy status to other drugs, medicaments and biological substances status: Secondary | ICD-10-CM | POA: Diagnosis not present

## 2015-08-13 DIAGNOSIS — H02403 Unspecified ptosis of bilateral eyelids: Secondary | ICD-10-CM | POA: Diagnosis not present

## 2015-08-13 DIAGNOSIS — Z87891 Personal history of nicotine dependence: Secondary | ICD-10-CM | POA: Diagnosis not present

## 2015-08-13 DIAGNOSIS — H0289 Other specified disorders of eyelid: Secondary | ICD-10-CM | POA: Diagnosis not present

## 2015-08-13 DIAGNOSIS — Z79899 Other long term (current) drug therapy: Secondary | ICD-10-CM | POA: Diagnosis not present

## 2015-08-13 DIAGNOSIS — E039 Hypothyroidism, unspecified: Secondary | ICD-10-CM | POA: Diagnosis not present

## 2015-08-13 DIAGNOSIS — Z91040 Latex allergy status: Secondary | ICD-10-CM | POA: Diagnosis not present

## 2015-08-13 DIAGNOSIS — H547 Unspecified visual loss: Secondary | ICD-10-CM | POA: Diagnosis not present

## 2015-08-13 DIAGNOSIS — I4891 Unspecified atrial fibrillation: Secondary | ICD-10-CM | POA: Diagnosis not present

## 2015-08-26 DIAGNOSIS — M7661 Achilles tendinitis, right leg: Secondary | ICD-10-CM | POA: Diagnosis not present

## 2015-08-26 DIAGNOSIS — M722 Plantar fascial fibromatosis: Secondary | ICD-10-CM | POA: Diagnosis not present

## 2015-09-21 DIAGNOSIS — M722 Plantar fascial fibromatosis: Secondary | ICD-10-CM | POA: Diagnosis not present

## 2015-09-21 DIAGNOSIS — M7661 Achilles tendinitis, right leg: Secondary | ICD-10-CM | POA: Diagnosis not present

## 2015-10-05 DIAGNOSIS — M24571 Contracture, right ankle: Secondary | ICD-10-CM | POA: Diagnosis not present

## 2015-10-05 DIAGNOSIS — M24572 Contracture, left ankle: Secondary | ICD-10-CM | POA: Diagnosis not present

## 2015-10-13 DIAGNOSIS — I700 Atherosclerosis of aorta: Secondary | ICD-10-CM | POA: Diagnosis not present

## 2015-10-13 DIAGNOSIS — E039 Hypothyroidism, unspecified: Secondary | ICD-10-CM | POA: Diagnosis not present

## 2015-10-13 DIAGNOSIS — E782 Mixed hyperlipidemia: Secondary | ICD-10-CM | POA: Diagnosis not present

## 2015-10-13 DIAGNOSIS — F419 Anxiety disorder, unspecified: Secondary | ICD-10-CM | POA: Diagnosis not present

## 2015-10-13 DIAGNOSIS — I480 Paroxysmal atrial fibrillation: Secondary | ICD-10-CM | POA: Diagnosis not present

## 2015-11-03 DIAGNOSIS — M24571 Contracture, right ankle: Secondary | ICD-10-CM | POA: Diagnosis not present

## 2015-11-03 DIAGNOSIS — M24572 Contracture, left ankle: Secondary | ICD-10-CM | POA: Diagnosis not present

## 2015-11-08 DIAGNOSIS — M24572 Contracture, left ankle: Secondary | ICD-10-CM | POA: Diagnosis not present

## 2015-11-08 DIAGNOSIS — M24571 Contracture, right ankle: Secondary | ICD-10-CM | POA: Diagnosis not present

## 2015-11-10 DIAGNOSIS — M24572 Contracture, left ankle: Secondary | ICD-10-CM | POA: Diagnosis not present

## 2015-11-10 DIAGNOSIS — M24571 Contracture, right ankle: Secondary | ICD-10-CM | POA: Diagnosis not present

## 2015-11-11 DIAGNOSIS — H2513 Age-related nuclear cataract, bilateral: Secondary | ICD-10-CM | POA: Diagnosis not present

## 2015-11-11 DIAGNOSIS — H1851 Endothelial corneal dystrophy: Secondary | ICD-10-CM | POA: Diagnosis not present

## 2015-11-12 DIAGNOSIS — M24571 Contracture, right ankle: Secondary | ICD-10-CM | POA: Diagnosis not present

## 2015-11-12 DIAGNOSIS — M24572 Contracture, left ankle: Secondary | ICD-10-CM | POA: Diagnosis not present

## 2015-11-15 DIAGNOSIS — M24572 Contracture, left ankle: Secondary | ICD-10-CM | POA: Diagnosis not present

## 2015-11-15 DIAGNOSIS — M24571 Contracture, right ankle: Secondary | ICD-10-CM | POA: Diagnosis not present

## 2015-11-17 DIAGNOSIS — M24572 Contracture, left ankle: Secondary | ICD-10-CM | POA: Diagnosis not present

## 2015-11-17 DIAGNOSIS — M24571 Contracture, right ankle: Secondary | ICD-10-CM | POA: Diagnosis not present

## 2015-11-19 DIAGNOSIS — M24571 Contracture, right ankle: Secondary | ICD-10-CM | POA: Diagnosis not present

## 2015-11-19 DIAGNOSIS — M24572 Contracture, left ankle: Secondary | ICD-10-CM | POA: Diagnosis not present

## 2015-12-08 DIAGNOSIS — M24571 Contracture, right ankle: Secondary | ICD-10-CM | POA: Diagnosis not present

## 2016-02-21 DIAGNOSIS — L218 Other seborrheic dermatitis: Secondary | ICD-10-CM | POA: Diagnosis not present

## 2016-02-21 DIAGNOSIS — L821 Other seborrheic keratosis: Secondary | ICD-10-CM | POA: Diagnosis not present

## 2016-02-21 DIAGNOSIS — L80 Vitiligo: Secondary | ICD-10-CM | POA: Diagnosis not present

## 2016-02-21 DIAGNOSIS — D1801 Hemangioma of skin and subcutaneous tissue: Secondary | ICD-10-CM | POA: Diagnosis not present

## 2016-04-12 ENCOUNTER — Telehealth: Payer: Self-pay | Admitting: Internal Medicine

## 2016-04-12 NOTE — Telephone Encounter (Signed)
New message       The pt has called sereval times and still has not received his medical records so he can get a life insurance policy set-up. The pt is very up-set he even states the insurance company has called.

## 2016-04-13 NOTE — Telephone Encounter (Signed)
Called spoke with patient this am-made him aware we have not received anything from his insurance [employer] [place]. He gave me the number to his agent Elmyra Ricks [phone] I called left her a VM asking her to call me back so she can fax me the signed release from the patient so we can get the process of the records started. Patient was also made aware of the process we take to get records completed. I also offered to copy the last 2 years of records for the patient if he wanted those to pick up. He stated he didn't need those for [place] to see what Elmyra Ricks ( agent) needs first.  Patient was not angry on phone and was very grateful for the call.

## 2016-04-14 DIAGNOSIS — E039 Hypothyroidism, unspecified: Secondary | ICD-10-CM | POA: Diagnosis not present

## 2016-04-14 DIAGNOSIS — I480 Paroxysmal atrial fibrillation: Secondary | ICD-10-CM | POA: Diagnosis not present

## 2016-04-14 DIAGNOSIS — E782 Mixed hyperlipidemia: Secondary | ICD-10-CM | POA: Diagnosis not present

## 2016-04-14 DIAGNOSIS — F419 Anxiety disorder, unspecified: Secondary | ICD-10-CM | POA: Diagnosis not present

## 2016-05-19 DIAGNOSIS — Z888 Allergy status to other drugs, medicaments and biological substances status: Secondary | ICD-10-CM | POA: Diagnosis not present

## 2016-05-19 DIAGNOSIS — Z87891 Personal history of nicotine dependence: Secondary | ICD-10-CM | POA: Diagnosis not present

## 2016-05-19 DIAGNOSIS — S62522B Displaced fracture of distal phalanx of left thumb, initial encounter for open fracture: Secondary | ICD-10-CM | POA: Diagnosis not present

## 2016-05-19 DIAGNOSIS — Z89012 Acquired absence of left thumb: Secondary | ICD-10-CM | POA: Diagnosis not present

## 2016-05-19 DIAGNOSIS — W231XXA Caught, crushed, jammed, or pinched between stationary objects, initial encounter: Secondary | ICD-10-CM | POA: Diagnosis not present

## 2016-05-19 DIAGNOSIS — Z79899 Other long term (current) drug therapy: Secondary | ICD-10-CM | POA: Diagnosis not present

## 2016-05-19 DIAGNOSIS — I4891 Unspecified atrial fibrillation: Secondary | ICD-10-CM | POA: Diagnosis not present

## 2016-05-19 DIAGNOSIS — E079 Disorder of thyroid, unspecified: Secondary | ICD-10-CM | POA: Diagnosis not present

## 2016-05-19 DIAGNOSIS — M79642 Pain in left hand: Secondary | ICD-10-CM | POA: Diagnosis not present

## 2016-05-19 DIAGNOSIS — Z91040 Latex allergy status: Secondary | ICD-10-CM | POA: Diagnosis not present

## 2016-05-19 DIAGNOSIS — S68521A Partial traumatic transphalangeal amputation of right thumb, initial encounter: Secondary | ICD-10-CM | POA: Diagnosis not present

## 2016-05-19 HISTORY — PX: OTHER SURGICAL HISTORY: SHX169

## 2016-05-24 DIAGNOSIS — M79645 Pain in left finger(s): Secondary | ICD-10-CM | POA: Diagnosis not present

## 2016-05-24 DIAGNOSIS — S68129A Partial traumatic metacarpophalangeal amputation of unspecified finger, initial encounter: Secondary | ICD-10-CM | POA: Diagnosis not present

## 2016-06-07 DIAGNOSIS — M79645 Pain in left finger(s): Secondary | ICD-10-CM | POA: Diagnosis not present

## 2016-06-07 DIAGNOSIS — S68129A Partial traumatic metacarpophalangeal amputation of unspecified finger, initial encounter: Secondary | ICD-10-CM | POA: Diagnosis not present

## 2016-08-01 DIAGNOSIS — Z23 Encounter for immunization: Secondary | ICD-10-CM | POA: Diagnosis not present

## 2016-08-15 DIAGNOSIS — M79606 Pain in leg, unspecified: Secondary | ICD-10-CM | POA: Diagnosis not present

## 2016-08-15 DIAGNOSIS — R229 Localized swelling, mass and lump, unspecified: Secondary | ICD-10-CM | POA: Diagnosis not present

## 2016-08-15 DIAGNOSIS — R41840 Attention and concentration deficit: Secondary | ICD-10-CM | POA: Diagnosis not present

## 2016-08-24 ENCOUNTER — Encounter: Payer: Self-pay | Admitting: Internal Medicine

## 2016-09-05 ENCOUNTER — Encounter: Payer: Self-pay | Admitting: Internal Medicine

## 2016-09-05 ENCOUNTER — Encounter (INDEPENDENT_AMBULATORY_CARE_PROVIDER_SITE_OTHER): Payer: Self-pay

## 2016-09-05 ENCOUNTER — Ambulatory Visit (INDEPENDENT_AMBULATORY_CARE_PROVIDER_SITE_OTHER): Payer: Medicare Other | Admitting: Internal Medicine

## 2016-09-05 VITALS — BP 142/78 | HR 74 | Ht 68.0 in | Wt 190.0 lb

## 2016-09-05 DIAGNOSIS — I491 Atrial premature depolarization: Secondary | ICD-10-CM | POA: Diagnosis not present

## 2016-09-05 DIAGNOSIS — I480 Paroxysmal atrial fibrillation: Secondary | ICD-10-CM

## 2016-09-05 NOTE — Progress Notes (Signed)
      Patient Care Team: Briscoe Deutscher, MD as PCP - General (Family Medicine)   HPI  Marc Benson is a 65 y.o. male Seen in followup for paroxysmal atrial fibrillation. He has undergone pulmonary vein isolation.  The patient denies chest pain, shortness of breath, nocturnal dyspnea, orthopnea or peripheral edema. There have been no palpitations, lightheadedness or syncope.  He continues to struggle with anxiety. This is somewhat better.  He has had no significant atrial fibrillation of which  he is aware.  His CHADS-VASc score is 1  Past Medical History:  Diagnosis Date  . ANXIETY 05/14/2009  . HYPERTENSION 05/14/2009  . PAROXYSMAL ATRIAL FIBRILLATION 05/14/2009   s/p PVI x 2 [facility]  . PREMATURE VENTRICULAR CONTRACTIONS 05/14/2009  . SLEEP DISORDER, HX OF 05/14/2009    Past Surgical History:  Procedure Laterality Date  . CARDIAC CATHETERIZATION  11/18/2004      Current Outpatient Prescriptions  Medication Sig Dispense Refill  . ALPRAZolam (XANAX) 1 MG tablet Take 1 mg by mouth at bedtime as needed for anxiety or sleep.     Marland Kitchen levothyroxine (SYNTHROID, LEVOTHROID) 50 MCG tablet Take 50 mcg by mouth daily.     . nortriptyline (PAMELOR) 10 MG capsule Take 10 mg by mouth at bedtime.     . rosuvastatin (CRESTOR) 10 MG tablet Take 10 mg by mouth daily.     No current facility-administered medications for this visit.     Allergies  Allergen Reactions  . Latex Itching and Rash    "LATEX TAPE"  . Benadryl [Diphenhydramine Hcl]     Review of Systems negative except from HPI and PMH  Physical Exam BP (!) 142/78   Pulse 74   Ht 5\' 8"  (1.727 m)   Wt 190 lb (86.2 kg)   BMI 28.89 kg/m  Well developed and nourished in no acute distress HENT normal Neck supple with JVP-flat Clear Regular rate and rhythm, no murmurs or gallops Abd-soft with active BS No Clubbing cyanosis edema Skin-warm and dry A & Oriented  Grossly normal sensory and motor function  Dry  ECG demonstrates  sinus rhythm @ 74 Inter101 Otherwise normal  Assessment and  Plan  Atrial fibrillation s/p PVI x2  PACs -frequent  Anxiety  Elevated blood Pressure   palpiations quiet   Keep track of BP at home

## 2016-09-05 NOTE — Patient Instructions (Signed)
Medication Instructions: - Your physician recommends that you continue on your current medications as directed. Please refer to the Current Medication list given to you today  Labwork: - none ordered  Procedures/Testing: - none ordered  Follow-Up: - Your physician wants you to follow-up in: 1 year with Amber Seiler, NP for Dr. Klein. You will receive a reminder letter in the mail two months in advance. If you don't receive a letter, please call our office to schedule the follow-up appointment.   Any Additional Special Instructions Will Be Listed Below (If Applicable).     If you need a refill on your cardiac medications before your next appointment, please call your pharmacy.   

## 2016-12-06 DIAGNOSIS — H1851 Endothelial corneal dystrophy: Secondary | ICD-10-CM | POA: Diagnosis not present

## 2016-12-06 DIAGNOSIS — H2513 Age-related nuclear cataract, bilateral: Secondary | ICD-10-CM | POA: Diagnosis not present

## 2017-01-09 DIAGNOSIS — H9191 Unspecified hearing loss, right ear: Secondary | ICD-10-CM | POA: Diagnosis not present

## 2017-01-09 DIAGNOSIS — H6121 Impacted cerumen, right ear: Secondary | ICD-10-CM | POA: Diagnosis not present

## 2017-01-27 DIAGNOSIS — S2341XA Sprain of ribs, initial encounter: Secondary | ICD-10-CM | POA: Diagnosis not present

## 2017-02-14 ENCOUNTER — Emergency Department (HOSPITAL_BASED_OUTPATIENT_CLINIC_OR_DEPARTMENT_OTHER): Payer: Medicare Other

## 2017-02-14 ENCOUNTER — Encounter (HOSPITAL_BASED_OUTPATIENT_CLINIC_OR_DEPARTMENT_OTHER): Payer: Self-pay

## 2017-02-14 ENCOUNTER — Emergency Department (HOSPITAL_BASED_OUTPATIENT_CLINIC_OR_DEPARTMENT_OTHER)
Admission: EM | Admit: 2017-02-14 | Discharge: 2017-02-14 | Disposition: A | Discharge status: Home / Self Care | Payer: Medicare Other | Attending: Emergency Medicine | Admitting: Emergency Medicine

## 2017-02-14 DIAGNOSIS — K5730 Diverticulosis of large intestine without perforation or abscess without bleeding: Secondary | ICD-10-CM | POA: Diagnosis not present

## 2017-02-14 DIAGNOSIS — Z87891 Personal history of nicotine dependence: Secondary | ICD-10-CM | POA: Diagnosis not present

## 2017-02-14 DIAGNOSIS — Z91040 Latex allergy status: Secondary | ICD-10-CM | POA: Insufficient documentation

## 2017-02-14 DIAGNOSIS — Z79899 Other long term (current) drug therapy: Secondary | ICD-10-CM | POA: Diagnosis not present

## 2017-02-14 DIAGNOSIS — N132 Hydronephrosis with renal and ureteral calculous obstruction: Secondary | ICD-10-CM | POA: Diagnosis not present

## 2017-02-14 DIAGNOSIS — R1030 Lower abdominal pain, unspecified: Secondary | ICD-10-CM | POA: Diagnosis present

## 2017-02-14 DIAGNOSIS — N201 Calculus of ureter: Secondary | ICD-10-CM | POA: Diagnosis not present

## 2017-02-14 LAB — COMPREHENSIVE METABOLIC PANEL
ALK PHOS: 56 U/L (ref 38–126)
ALT: 20 U/L (ref 17–63)
AST: 29 U/L (ref 15–41)
Albumin: 4.4 g/dL (ref 3.5–5.0)
Anion gap: 10 (ref 5–15)
BILIRUBIN TOTAL: 0.9 mg/dL (ref 0.3–1.2)
BUN: 18 mg/dL (ref 6–20)
CALCIUM: 9.2 mg/dL (ref 8.9–10.3)
CO2: 25 mmol/L (ref 22–32)
CREATININE: 1.16 mg/dL (ref 0.61–1.24)
Chloride: 102 mmol/L (ref 101–111)
GFR calc non Af Amer: >60 mL/min (ref >60)
GLUCOSE: 109 mg/dL — AB (ref 65–99)
Potassium: 3.7 mmol/L (ref 3.5–5.1)
SODIUM: 137 mmol/L (ref 135–145)
TOTAL PROTEIN: 7.3 g/dL (ref 6.5–8.1)

## 2017-02-14 LAB — CBC WITH DIFFERENTIAL/PLATELET
Basophils Absolute: 0 10*3/uL (ref 0.0–0.1)
Basophils Relative: 0 %
EOS ABS: 0.2 10*3/uL (ref 0.0–0.7)
Eosinophils Relative: 2 %
HEMATOCRIT: 41.1 % (ref 39.0–52.0)
HEMOGLOBIN: 14.2 g/dL (ref 13.0–17.0)
LYMPHS ABS: 1.6 10*3/uL (ref 0.7–4.0)
LYMPHS PCT: 15 %
MCH: 32.4 pg (ref 26.0–34.0)
MCHC: 34.5 g/dL (ref 30.0–36.0)
MCV: 93.8 fL (ref 78.0–100.0)
MONOS PCT: 8 %
Monocytes Absolute: 0.9 10*3/uL (ref 0.1–1.0)
NEUTROS ABS: 7.9 10*3/uL — AB (ref 1.7–7.7)
NEUTROS PCT: 75 %
Platelets: 225 10*3/uL (ref 150–400)
RBC: 4.38 MIL/uL (ref 4.22–5.81)
RDW: 12.9 % (ref 11.5–15.5)
WBC: 10.6 10*3/uL — AB (ref 4.0–10.5)

## 2017-02-14 LAB — URINALYSIS, ROUTINE W REFLEX MICROSCOPIC
Bilirubin Urine: NEGATIVE
Glucose, UA: NEGATIVE mg/dL
Ketones, ur: NEGATIVE mg/dL
NITRITE: NEGATIVE
PH: 6.5 (ref 5.0–8.0)
Protein, ur: NEGATIVE mg/dL
SPECIFIC GRAVITY, URINE: 1.019 (ref 1.005–1.030)

## 2017-02-14 LAB — URINALYSIS, MICROSCOPIC (REFLEX)

## 2017-02-14 MED ORDER — ONDANSETRON 4 MG PO TBDP
4.0000 mg | ORAL_TABLET | Freq: Once | ORAL | Status: DC
  Filled 2017-02-14: qty 1

## 2017-02-14 MED ORDER — ONDANSETRON 4 MG PO TBDP
4.0000 mg | ORAL_TABLET | Freq: Three times a day (TID) | ORAL | 1 refills | Status: DC | PRN
Start: 2017-02-14 — End: 2017-09-19

## 2017-02-14 MED ORDER — ONDANSETRON HCL 4 MG/2ML IJ SOLN
4.0000 mg | Freq: Once | INTRAMUSCULAR | Status: DC
  Filled 2017-02-14: qty 2

## 2017-02-14 MED ORDER — SODIUM CHLORIDE 0.9 % IV SOLN
INTRAVENOUS | Status: DC
  Administered 2017-02-14: 16:00:00 via INTRAVENOUS

## 2017-02-14 MED FILL — ONDANSETRON ODT 4 MG TABLET: 4 | 4 days supply | Qty: 10 | Fill #0

## 2017-02-14 NOTE — Discharge Instructions (Signed)
CT scan shows a right-sided 1 mm kidney stone. Follow-up with urology. Take pain medicine you have at home as needed. Take the Zofran prescribed here for nausea and vomiting. Return for any new or worse symptoms.

## 2017-02-14 NOTE — ED Provider Notes (Signed)
[facility] Provider Note   CSN: [id number] Arrival date & time: 02/14/17  1320     History   Chief Complaint Chief Complaint  Patient presents with  . Abdominal Pain    HPI Marc Benson is a 66 y.o. male.  Patient with complaint of suprapubic abdominal pain intermittently for the past 3 days. More severe today. Had it for a brief period time on Monday and then more severe today associated with nausea onset today was 11 AM. No vomiting no fevers. No blood in the urine. Does have a feeling of discomfort with urination and feeling like he needs to go when he doesn't need to. Denies any back pain or flank pain. No history of kidney stones.      Past Medical History:  Diagnosis Date  . ANXIETY 05/14/2009  . PAROXYSMAL ATRIAL FIBRILLATION 05/14/2009   s/p PVI x 2 [facility]  . PREMATURE VENTRICULAR CONTRACTIONS 05/14/2009  . SLEEP DISORDER, HX OF 05/14/2009    Patient Active Problem List   Diagnosis Date Noted  . ANXIETY 05/14/2009  . HYPERTENSION 05/14/2009  . PAROXYSMAL ATRIAL FIBRILLATION 05/14/2009  . PREMATURE VENTRICULAR CONTRACTIONS 05/14/2009  . SLEEP DISORDER, HX OF 05/14/2009    Past Surgical History:  Procedure Laterality Date  . ABLATION    . CARDIAC CATHETERIZATION  11/18/2004         Home Medications    Prior to Admission medications   Medication Sig Start Date End Date Taking? Authorizing Provider  ALPRAZolam Duanne Moron) 1 MG tablet Take 1 mg by mouth at bedtime as needed for anxiety or sleep.  08/28/11   [provider]  levothyroxine (SYNTHROID, LEVOTHROID) 50 MCG tablet Take 50 mcg by mouth daily.  08/28/11   [provider]  nortriptyline (PAMELOR) 10 MG capsule Take 10 mg by mouth at bedtime.  08/28/11   [provider]  ondansetron (ZOFRAN ODT) 4 MG disintegrating tablet Take 1 tablet (4 mg total) by mouth every 8 (eight) hours as needed for nausea or vomiting. 02/14/17   Fredia Sorrow, MD  rosuvastatin (CRESTOR) 10 MG  tablet Take 10 mg by mouth daily.    [provider]    Family History Family History  Problem Relation Age of Onset  . Aneurysm Mother   . Heart attack Father     Social History Social History  Substance Use Topics  . Smoking status: Former Smoker    Quit date: 09/19/1971  . Smokeless tobacco: Never Used  . Alcohol use No     Allergies   Latex and Benadryl [diphenhydramine hcl]   Review of Systems Review of Systems  Constitutional: Negative for fever.  HENT: Negative for congestion.   Eyes: Negative for redness.  Respiratory: Negative for shortness of breath.   Cardiovascular: Negative for chest pain.  Gastrointestinal: Positive for abdominal pain.  Genitourinary: Positive for dysuria and urgency. Negative for flank pain.  Musculoskeletal: Negative for back pain.  Skin: Negative for rash.  Neurological: Negative for headaches.  Hematological: Does not bruise/bleed easily.  Psychiatric/Behavioral: Negative for confusion.     Physical Exam Updated Vital Signs BP (!) 145/89 (BP Location: Right Arm)   Pulse 67   Temp 98.4 F (36.9 C) (Oral)   Resp 16   Ht 1.727 m (5\' 8" )   Wt 86.2 kg (190 lb)   SpO2 97%   BMI 28.89 kg/m   Physical Exam  Constitutional: He is oriented to person, place, and time. He appears well-developed and well-nourished. No  distress.  HENT:  Head: Normocephalic and atraumatic.  Mouth/Throat: Oropharynx is clear and moist.  Eyes: Conjunctivae and EOM are normal. Pupils are equal, round, and reactive to light.  Neck: Normal range of motion. Neck supple.  Cardiovascular: Normal rate, regular rhythm and normal heart sounds.   Pulmonary/Chest: Effort normal and breath sounds normal.  Abdominal: Soft. Bowel sounds are normal. There is no tenderness.  Musculoskeletal: Normal range of motion.  Neurological: He is alert and oriented to person, place, and time. No cranial nerve deficit or sensory deficit. He exhibits normal muscle tone.  Coordination normal.  Skin: Skin is warm.  Nursing note and vitals reviewed.    ED Treatments / Results  Labs (all labs ordered are listed, but only abnormal results are displayed) Labs Reviewed  URINALYSIS, ROUTINE W REFLEX MICROSCOPIC - Abnormal; Notable for the following:       Result Value   Hgb urine dipstick MODERATE (*)    Leukocytes, UA TRACE (*)    All other components within normal limits  CBC WITH DIFFERENTIAL/PLATELET - Abnormal; Notable for the following:    WBC 10.6 (*)    Neutro Abs 7.9 (*)    All other components within normal limits  COMPREHENSIVE METABOLIC PANEL - Abnormal; Notable for the following:    Glucose, Bld 109 (*)    All other components within normal limits  URINALYSIS, MICROSCOPIC (REFLEX) - Abnormal; Notable for the following:    Bacteria, UA FEW (*)    Squamous Epithelial / LPF 0-5 (*)    All other components within normal limits    EKG  EKG Interpretation None       Radiology Ct Renal Stone Study  Result Date: 02/14/2017 CLINICAL DATA:  Pain in lower abd since 11am, some nausea. Had same pain on Monday. EXAM: CT ABDOMEN AND PELVIS WITHOUT CONTRAST TECHNIQUE: Multidetector CT imaging of the abdomen and pelvis was performed following the standard protocol without IV contrast. COMPARISON:  None. FINDINGS: Lower chest: Scattered coronary calcifications. No pleural effusion. Hepatobiliary: No focal liver abnormality is seen. No gallstones, gallbladder wall thickening, or biliary dilatation. Pancreas: Unremarkable. No pancreatic ductal dilatation or surrounding inflammatory changes. Spleen: Normal in size without focal abnormality. Adrenals/Urinary Tract: Normal adrenal glands.  Normal left kidney. Inflammatory/edematous changes around the right kidney. Right hydronephrosis and ureterectasis. There is a 1 mm calculus in the distal ureter just proximal to the ureteral orifice. Urinary bladder is nondistended, unremarkable. Stomach/Bowel: Stomach, small  bowel, and colon are nondilated. Normal appendix. Scattered diverticula most numerous in the distal descending and sigmoid portions, without significant adjacent inflammatory/ edematous change or abscess. Vascular/Lymphatic: Moderate aortoiliac arterial calcifications without aneurysm. No definite adenopathy. Reproductive: Moderate prostatic enlargement with central coarse calcifications. Metallic clips in the scrotum. Other: No ascites.  No free air. Musculoskeletal: Mild spondylitic changes in the visualized lower thoracic and lumbar spine. Negative for fracture or worrisome bone lesion. IMPRESSION: 1. 1 mm distal right ureteral calculus with hydronephrosis and ureterectasis. 2. Coronary and aortoiliac arterial calcifications. 3. Descending and sigmoid colon diverticulosis. Electronically Signed   By: Lucrezia Europe M.D.   On: 02/14/2017 16:42    Procedures Procedures (including critical care time)  Medications Ordered in ED Medications  ondansetron (ZOFRAN) injection 4 mg (4 mg Intravenous Not Given 02/14/17 1537)  0.9 %  sodium chloride infusion ( Intravenous Stopped 02/14/17 1720)  ondansetron (ZOFRAN-ODT) disintegrating tablet 4 mg (4 mg Oral Not Given 02/14/17 1705)     Initial Impression / Assessment and Plan /  ED Course  I have reviewed the triage vital signs and the nursing notes.  Pertinent labs & imaging results that were available during my care of the patient were reviewed by me and considered in my medical decision making (see chart for details).     CT shows evidence of a right sided 1 mm ureteral stone. No complicating factors. Renal function is normal. Patient nontoxic no acute distress. Will give patient follow-up with [facility]. Patient has pain medicine at home. We'll provide Zofran for the nausea. Patient return for any new or worse symptoms.  Final Clinical Impressions(s) / ED Diagnoses   Final diagnoses:  Right ureteral stone    New Prescriptions New Prescriptions     ONDANSETRON (ZOFRAN ODT) 4 MG DISINTEGRATING TABLET    Take 1 tablet (4 mg total) by mouth every 8 (eight) hours as needed for nausea or vomiting.     Fredia Sorrow, MD 02/14/17 1726

## 2017-02-14 NOTE — ED Triage Notes (Signed)
C/o abd pain, nausea, dysuria x 3 days-presents to triage in w/c-grimacing

## 2017-03-01 DIAGNOSIS — L578 Other skin changes due to chronic exposure to nonionizing radiation: Secondary | ICD-10-CM | POA: Diagnosis not present

## 2017-03-01 DIAGNOSIS — D2372 Other benign neoplasm of skin of left lower limb, including hip: Secondary | ICD-10-CM | POA: Diagnosis not present

## 2017-03-01 DIAGNOSIS — L812 Freckles: Secondary | ICD-10-CM | POA: Diagnosis not present

## 2017-03-01 DIAGNOSIS — L821 Other seborrheic keratosis: Secondary | ICD-10-CM | POA: Diagnosis not present

## 2017-03-01 DIAGNOSIS — D1801 Hemangioma of skin and subcutaneous tissue: Secondary | ICD-10-CM | POA: Diagnosis not present

## 2017-03-01 DIAGNOSIS — L218 Other seborrheic dermatitis: Secondary | ICD-10-CM | POA: Diagnosis not present

## 2017-03-20 ENCOUNTER — Telehealth: Payer: Self-pay | Admitting: Internal Medicine

## 2017-03-20 NOTE — Telephone Encounter (Signed)
Received call from patient who is reporting he has been having chest tightness and heaviness that is new to him.  He has been having it for appr 1 week.  He notices it more at rest and feels like he needs to take a deep breath but that doesn't lead to relief.  He has experienced a sharp pain in his left arm pit from time to time.  He has a Hx of At Fib but he states this doesn't feel anything like that.  Advised to report to the closest ED for further evaluation.  Pt is in agreement.

## 2017-03-20 NOTE — Telephone Encounter (Signed)
Per pt call some chest tightness and it is concerning. Some irregular heart beats, Pt would like to be seen.

## 2017-04-19 DIAGNOSIS — Z0000 Encounter for general adult medical examination without abnormal findings: Secondary | ICD-10-CM | POA: Diagnosis not present

## 2017-04-19 DIAGNOSIS — I480 Paroxysmal atrial fibrillation: Secondary | ICD-10-CM | POA: Diagnosis not present

## 2017-04-19 DIAGNOSIS — E782 Mixed hyperlipidemia: Secondary | ICD-10-CM | POA: Diagnosis not present

## 2017-04-19 DIAGNOSIS — Z1211 Encounter for screening for malignant neoplasm of colon: Secondary | ICD-10-CM | POA: Diagnosis not present

## 2017-04-19 DIAGNOSIS — I700 Atherosclerosis of aorta: Secondary | ICD-10-CM | POA: Diagnosis not present

## 2017-04-19 DIAGNOSIS — F419 Anxiety disorder, unspecified: Secondary | ICD-10-CM | POA: Diagnosis not present

## 2017-04-19 DIAGNOSIS — E039 Hypothyroidism, unspecified: Secondary | ICD-10-CM | POA: Diagnosis not present

## 2017-06-27 DIAGNOSIS — Z23 Encounter for immunization: Secondary | ICD-10-CM | POA: Diagnosis not present

## 2017-09-19 ENCOUNTER — Ambulatory Visit (INDEPENDENT_AMBULATORY_CARE_PROVIDER_SITE_OTHER): Payer: Medicare Other | Admitting: Internal Medicine

## 2017-09-19 VITALS — BP 128/70 | HR 68 | Ht 68.5 in | Wt 194.0 lb

## 2017-09-19 DIAGNOSIS — I480 Paroxysmal atrial fibrillation: Secondary | ICD-10-CM | POA: Diagnosis not present

## 2017-09-19 NOTE — Patient Instructions (Signed)
Medication Instructions:  Your physician recommends that you continue on your current medications as directed. Please refer to the Current Medication list given to you today.  * If you need a refill on your cardiac medications before your next appointment, please call your pharmacy. *  Labwork: None ordered  Testing/Procedures: None ordered  Follow-Up: Your physician wants you to follow-up in: 1 year with Chanetta Marshall, NP.  You will receive a reminder letter in the mail two months in advance. If you don't receive a letter, please call our office to schedule the follow-up appointment.  Thank you for choosing [facility]!!

## 2017-09-19 NOTE — Progress Notes (Signed)
      Patient Care Team: [name], DO as PCP - General (Family Medicine)   HPI  Marc Benson is a 67 y.o. male Seen in followup for paroxysmal atrial fibrillation. He has undergone pulmonary vein isolation.  X 2   No interval afib of which he is aware,  Recurrent 10-20 sec runs of tachypalpitations unassoc with LH or DSOB.  Very active physically  No edema  Anxiety under pretty good control      His CHADS-VASc score is 1  Past Medical History:  Diagnosis Date  . ANXIETY 05/14/2009  . PAROXYSMAL ATRIAL FIBRILLATION 05/14/2009   s/p PVI x 2 [facility]  . PREMATURE VENTRICULAR CONTRACTIONS 05/14/2009  . SLEEP DISORDER, HX OF 05/14/2009    Past Surgical History:  Procedure Laterality Date  . ABLATION    . CARDIAC CATHETERIZATION  11/18/2004      Current Outpatient Medications  Medication Sig Dispense Refill  . ALPRAZolam (XANAX) 1 MG tablet Take 1 mg by mouth at bedtime as needed for anxiety or sleep.     Marland Kitchen levothyroxine (SYNTHROID, LEVOTHROID) 50 MCG tablet Take 50 mcg by mouth daily.     . nortriptyline (PAMELOR) 10 MG capsule Take 10 mg by mouth at bedtime.     . rosuvastatin (CRESTOR) 10 MG tablet Take 10 mg by mouth daily.     No current facility-administered medications for this visit.     Allergies  Allergen Reactions  . Latex Itching and Rash    "LATEX TAPE"  . Benadryl [Diphenhydramine Hcl]     Review of Systems negative except from HPI and PMH  Physical Exam BP 128/70   Pulse 68   Ht 5' 8.5" (1.74 m)   Wt 194 lb (88 kg)   BMI 29.07 kg/m   Well developed and nourished in no acute distress HENT normal Neck supple with JVP-flat Clear Regular rate and rhythm, no murmurs or gallops Abd-soft with active BS No Clubbing cyanosis edema Skin-warm and dry A & Oriented  Grossly normal sensory and motor function     ECG demonstrates  NSR@ 68 17/10/43   Assessment and  Plan  Atrial fibrillation s/p PVI x2  PACs -frequent  Anxiety  Elevated blood  Pressure  BP has been better  No afib  Some palpitations--unchanged  Continue current therapy

## 2017-12-07 DIAGNOSIS — H2513 Age-related nuclear cataract, bilateral: Secondary | ICD-10-CM | POA: Diagnosis not present

## 2017-12-07 DIAGNOSIS — H43821 Vitreomacular adhesion, right eye: Secondary | ICD-10-CM | POA: Diagnosis not present

## 2017-12-07 DIAGNOSIS — H1851 Endothelial corneal dystrophy: Secondary | ICD-10-CM | POA: Diagnosis not present

## 2018-03-04 DIAGNOSIS — D1801 Hemangioma of skin and subcutaneous tissue: Secondary | ICD-10-CM | POA: Diagnosis not present

## 2018-03-04 DIAGNOSIS — L918 Other hypertrophic disorders of the skin: Secondary | ICD-10-CM | POA: Diagnosis not present

## 2018-03-04 DIAGNOSIS — L218 Other seborrheic dermatitis: Secondary | ICD-10-CM | POA: Diagnosis not present

## 2018-03-04 DIAGNOSIS — L821 Other seborrheic keratosis: Secondary | ICD-10-CM | POA: Diagnosis not present

## 2018-03-04 DIAGNOSIS — D2372 Other benign neoplasm of skin of left lower limb, including hip: Secondary | ICD-10-CM | POA: Diagnosis not present

## 2018-03-04 DIAGNOSIS — L812 Freckles: Secondary | ICD-10-CM | POA: Diagnosis not present

## 2018-04-25 DIAGNOSIS — Z125 Encounter for screening for malignant neoplasm of prostate: Secondary | ICD-10-CM | POA: Diagnosis not present

## 2018-04-25 DIAGNOSIS — I700 Atherosclerosis of aorta: Secondary | ICD-10-CM | POA: Diagnosis not present

## 2018-04-25 DIAGNOSIS — Z0000 Encounter for general adult medical examination without abnormal findings: Secondary | ICD-10-CM | POA: Diagnosis not present

## 2018-04-25 DIAGNOSIS — E039 Hypothyroidism, unspecified: Secondary | ICD-10-CM | POA: Diagnosis not present

## 2018-04-25 DIAGNOSIS — E782 Mixed hyperlipidemia: Secondary | ICD-10-CM | POA: Diagnosis not present

## 2018-04-25 DIAGNOSIS — F419 Anxiety disorder, unspecified: Secondary | ICD-10-CM | POA: Diagnosis not present

## 2018-04-25 DIAGNOSIS — I480 Paroxysmal atrial fibrillation: Secondary | ICD-10-CM | POA: Diagnosis not present

## 2018-04-25 DIAGNOSIS — R350 Frequency of micturition: Secondary | ICD-10-CM | POA: Diagnosis not present

## 2018-05-01 DIAGNOSIS — Z1211 Encounter for screening for malignant neoplasm of colon: Secondary | ICD-10-CM | POA: Diagnosis not present

## 2018-05-01 DIAGNOSIS — K5730 Diverticulosis of large intestine without perforation or abscess without bleeding: Secondary | ICD-10-CM | POA: Diagnosis not present

## 2018-05-01 HISTORY — PX: COLONOSCOPY: SHX174

## 2018-05-28 DIAGNOSIS — R739 Hyperglycemia, unspecified: Secondary | ICD-10-CM | POA: Diagnosis not present

## 2018-05-28 DIAGNOSIS — E039 Hypothyroidism, unspecified: Secondary | ICD-10-CM | POA: Diagnosis not present

## 2018-07-02 DIAGNOSIS — Z23 Encounter for immunization: Secondary | ICD-10-CM | POA: Diagnosis not present

## 2018-07-09 ENCOUNTER — Telehealth: Payer: Self-pay | Admitting: Nurse Practitioner

## 2018-07-09 ENCOUNTER — Telehealth: Payer: Self-pay | Admitting: Internal Medicine

## 2018-07-09 NOTE — Telephone Encounter (Signed)
SPOKE WITH PT HAS CONCERNS RE ELEVATED HEART RATE IS HAPPENING MORE THAN BEFORE AND LASTING LONGER .PER PT HAD EPISODE LAST NOC THAT LASTED 10 MIN  AT A RATE OF 170 AND DROPPED TO 88-93 WHICH IS STILL HIGHER THAN NORMAL FOR PT. PT HAS HAD 2 ABLATION IN PAST IS REQUESTING A MONITOR .APPT MADE WITH MICHELE LENZE PA FOR TOM AT 2:00 PM .Adonis Housekeeper

## 2018-07-09 NOTE — Telephone Encounter (Signed)
New Message          Patient is calling today to let us know that he is having trouble with his heart just running away and would  Like to get a heart monitor if possible.

## 2018-07-09 NOTE — Telephone Encounter (Signed)
New Message          Patient c/o Palpitations:  High priority if patient c/o lightheadedness, shortness of breath, or chest pain  1) How long have you had palpitations/irregular HR/ Afib? Are you having the symptoms now? Last 3 weeks   2) Are you currently experiencing lightheadedness, SOB or CP? SOB  3) Do you have a history of afib (atrial fibrillation) or irregular heart rhythm? Not sure  4) Have you checked your BP or HR? (document readings if available): Not sure  5) Are you experiencing any other symptoms?  Just the heart racing regularly

## 2018-07-09 NOTE — Telephone Encounter (Signed)
SEE OTHER PHONE NOTE./CY 

## 2018-07-10 ENCOUNTER — Ambulatory Visit (INDEPENDENT_AMBULATORY_CARE_PROVIDER_SITE_OTHER): Payer: Medicare Other | Admitting: Physician Assistant

## 2018-07-10 ENCOUNTER — Ambulatory Visit (INDEPENDENT_AMBULATORY_CARE_PROVIDER_SITE_OTHER): Payer: Medicare Other

## 2018-07-10 ENCOUNTER — Encounter: Payer: Self-pay | Admitting: Physician Assistant

## 2018-07-10 VITALS — BP 136/82 | HR 78 | Ht 68.5 in | Wt 192.8 lb

## 2018-07-10 DIAGNOSIS — I480 Paroxysmal atrial fibrillation: Secondary | ICD-10-CM | POA: Diagnosis not present

## 2018-07-10 DIAGNOSIS — I493 Ventricular premature depolarization: Secondary | ICD-10-CM

## 2018-07-10 MED ORDER — METOPROLOL TARTRATE 25 MG PO TABS: 25.0000 mg | ORAL_TABLET | ORAL | 3 refills | Status: DC | PRN

## 2018-07-10 NOTE — Progress Notes (Signed)
Cardiology Office Note    Date:  07/10/2018   ID:  Toma, Arts 1951/03/29, MRN [id number]  PCP:  [name], DO  Cardiologist: No primary care provider on file. EPS: None  No chief complaint on file.   History of Present Illness:  Marc Benson is a 67 y.o. male history of paroxysmal atrial fibrillation status post pulmonary vein isolation x2.  Last saw Dr. Caryl Comes 09/19/2017 at which time he was having 10-22nd runs of tachypalpitations without symptoms.  CHA2DS2-VASc equals 1.  Also has hypertension, frequent PACs and anxiety.   Patient called in yesterday and HR was up to 170 for 10 min and then dropped to 88-93 which is higher than normal. Felt flushed and a strange deep pressure in his chest but not a tightness.  Also having increased runs of 10-15 secs tachycardia occurring every other day at random times. Doesn't think it's Afib because it's regular and fast. No caffeine or decongestants. Did have some M&M's but that's not unusual. Golfs several days/week. Thyroid normal a month ago at [place].   Past Medical History:  Diagnosis Date  . ANXIETY 05/14/2009  . PAROXYSMAL ATRIAL FIBRILLATION 05/14/2009   s/p PVI x 2 [facility]  . PREMATURE VENTRICULAR CONTRACTIONS 05/14/2009  . SLEEP DISORDER, HX OF 05/14/2009    Past Surgical History:  Procedure Laterality Date  . ABLATION    . CARDIAC CATHETERIZATION  11/18/2004      Current Medications: Current Meds  Medication Sig  . ALPRAZolam (XANAX) 1 MG tablet Take 1 mg by mouth at bedtime as needed for anxiety or sleep.   Marland Kitchen levothyroxine (SYNTHROID, LEVOTHROID) 50 MCG tablet Take 50 mcg by mouth daily.   . nortriptyline (PAMELOR) 10 MG capsule Take 10 mg by mouth at bedtime.   . rosuvastatin (CRESTOR) 10 MG tablet Take 10 mg by mouth daily.     Allergies:   Latex and Benadryl [diphenhydramine hcl]   Social History   Socioeconomic History  . Marital status: Married    Spouse name: Not on file  . Number of children: Not on file    . Years of education: Not on file  . Highest education level: Not on file  Occupational History  . Not on file  Social Needs  . Financial resource strain: Not on file  . Food insecurity:    Worry: Not on file    Inability: Not on file  . Transportation needs:    Medical: Not on file    Non-medical: Not on file  Tobacco Use  . Smoking status: Former Smoker    Last attempt to quit: 09/19/1971    Years since quitting: 46.8  . Smokeless tobacco: Never Used  Substance and Sexual Activity  . Alcohol use: No  . Drug use: No  . Sexual activity: Not on file  Lifestyle  . Physical activity:    Days per week: Not on file    Minutes per session: Not on file  . Stress: Not on file  Relationships  . Social connections:    Talks on phone: Not on file    Gets together: Not on file    Attends religious service: Not on file    Active member of club or organization: Not on file    Attends meetings of clubs or organizations: Not on file    Relationship status: Not on file  Other Topics Concern  . Not on file  Social History Narrative  . Not on file  Family History:  The patient's family history includes Aneurysm in his mother; Heart attack in his father.   ROS:   Please see the history of present illness.    Review of Systems  Constitution: Negative.  HENT: Negative.   Cardiovascular: Positive for palpitations.  Respiratory: Negative.   Endocrine: Negative.   Hematologic/Lymphatic: Negative.   Musculoskeletal: Negative.   Gastrointestinal: Negative.   Genitourinary: Negative.   Neurological: Negative.    All other systems reviewed and are negative.   PHYSICAL EXAM:   VS:  BP 136/82   Pulse 78   Ht 5' 8.5" (1.74 m)   Wt 192 lb 12.8 oz (87.5 kg)   SpO2 98%   BMI 28.89 kg/m   Physical Exam  GEN: Well nourished, well developed, in no acute distress  Neck: no JVD, carotid bruits, or masses Cardiac:RRR; no murmurs, rubs, or gallops  Respiratory:  clear to auscultation  bilaterally, normal work of breathing GI: soft, nontender, nondistended, + BS Ext: without cyanosis, clubbing, or edema, Good distal pulses bilaterally Neuro:  Alert and Oriented x 3 Psych: euthymic mood, full affect  Wt Readings from Last 3 Encounters:  07/10/18 192 lb 12.8 oz (87.5 kg)  09/19/17 194 lb (88 kg)  02/14/17 190 lb (86.2 kg)      Studies/Labs Reviewed:   EKG:  EKG is ordered today.  The ekg ordered today demonstrates NSR with PAC's  Recent Labs: No results found for requested labs within last 8760 hours.   Lipid Panel No results found for: CHOL, TRIG, HDL, CHOLHDL, VLDL, LDLCALC, LDLDIRECT  Additional studies/ records that were reviewed today include:       ASSESSMENT:    1. Paroxysmal atrial fibrillation (HCC)   2. PVC's (premature ventricular contractions)      PLAN:  In order of problems listed above:   PAF status post pulmonary vein isolation x2 followed closely by Dr. Caryl Comes.  Now having increased arrhythmias most prolonged was 10 minutes yesterday and also having increased 10 to 15 seconds arrhythmias almost on a daily basis.  Thyroid was normal last month per patient check to vehicle.  Will check electrolytes today.  Place live holter monitor, metoprolol 25 mg to take as needed and follow-up with Dr. Caryl Comes.  PVC's-has always had a lot and hasn't bothered him in the past.  We will see what PVC load is on Holter monitor.   Medication Adjustments/Labs and Tests Ordered: Current medicines are reviewed at length with the patient today.  Concerns regarding medicines are outlined above.  Medication changes, Labs and Tests ordered today are listed in the Patient Instructions below. Patient Instructions  Medication Instructions:  START: Metoprolol 25 MG as needed for Arrhythmias   If you need a refill on your cardiac medications before your next appointment, please call your pharmacy.   Lab work: TODAY: CBC & CMET  If you have labs (blood work) drawn  today and your tests are completely normal, you will receive your results only by: Marland Kitchen MyChart Message (if you have MyChart) OR . A paper copy in the mail If you have any lab test that is abnormal or we need to change your treatment, we will call you to review the results.  Testing/Procedures: Your physician has recommended that you wear an event monitor. Event monitors are medical devices that record the heart's electrical activity. Doctors most often [place] these monitors to diagnose arrhythmias. Arrhythmias are problems with the speed or rhythm of the heartbeat. The monitor is a small,  portable device. You can wear one while you do your normal daily activities. This is usually used to diagnose what is causing palpitations/syncope (passing out).    Follow-Up: You physician would like for you to schedule a appointment with Dr. Caryl Comes after you are done with your monitor   Any Other Special Instructions Will Be Listed Below (If Applicable).       Signed, Ermalinda Barrios, PA-C  07/10/2018 2:51 PM    [facility] [place], [place], [place]  [address] Phone: [phone]; Fax: [phone]

## 2018-07-10 NOTE — Patient Instructions (Addendum)
Medication Instructions:  START: Metoprolol 25 MG as needed for Arrhythmias   If you need a refill on your cardiac medications before your next appointment, please call your pharmacy.   Lab work: TODAY: CBC & CMET  If you have labs (blood work) drawn today and your tests are completely normal, you will receive your results only by: Marland Kitchen MyChart Message (if you have MyChart) OR . A paper copy in the mail If you have any lab test that is abnormal or we need to change your treatment, we will call you to review the results.  Testing/Procedures: Your physician has recommended that you wear an event monitor. Event monitors are medical devices that record the heart's electrical activity. Doctors most often [place] these monitors to diagnose arrhythmias. Arrhythmias are problems with the speed or rhythm of the heartbeat. The monitor is a small, portable device. You can wear one while you do your normal daily activities. This is usually used to diagnose what is causing palpitations/syncope (passing out).    Follow-Up: You physician would like for you to schedule a appointment with Dr. Caryl Comes after you are done with your monitor   Any Other Special Instructions Will Be Listed Below (If Applicable).

## 2018-07-11 LAB — COMPREHENSIVE METABOLIC PANEL
A/G RATIO: 2.1 (ref 1.2–2.2)
ALBUMIN: 4.4 g/dL (ref 3.6–4.8)
ALT: 21 IU/L (ref 0–44)
AST: 26 IU/L (ref 0–40)
Alkaline Phosphatase: 46 IU/L (ref 39–117)
BILIRUBIN TOTAL: 0.5 mg/dL (ref 0.0–1.2)
BUN / CREAT RATIO: 11 (ref 10–24)
BUN: 12 mg/dL (ref 8–27)
CHLORIDE: 103 mmol/L (ref 96–106)
CO2: 27 mmol/L (ref 20–29)
Calcium: 9.2 mg/dL (ref 8.6–10.2)
Creatinine, Ser: 1.12 mg/dL (ref 0.76–1.27)
GFR, EST AFRICAN AMERICAN: 78 mL/min/{1.73_m2} (ref >59)
GFR, EST NON AFRICAN AMERICAN: 67 mL/min/{1.73_m2} (ref >59)
GLUCOSE: 75 mg/dL (ref 65–99)
Globulin, Total: 2.1 g/dL (ref 1.5–4.5)
Potassium: 4 mmol/L (ref 3.5–5.2)
Sodium: 143 mmol/L (ref 134–144)
TOTAL PROTEIN: 6.5 g/dL (ref 6.0–8.5)

## 2018-07-11 LAB — CBC
HEMOGLOBIN: 13.8 g/dL (ref 13.0–17.7)
Hematocrit: 41.1 % (ref 37.5–51.0)
MCH: 31.7 pg (ref 26.6–33.0)
MCHC: 33.6 g/dL (ref 31.5–35.7)
MCV: 94 fL (ref 79–97)
Platelets: 253 10*3/uL (ref 150–450)
RBC: 4.36 x10E6/uL (ref 4.14–5.80)
RDW: 12 % — ABNORMAL LOW (ref 12.3–15.4)
WBC: 6.1 10*3/uL (ref 3.4–10.8)

## 2018-07-13 ENCOUNTER — Telehealth: Payer: Self-pay | Admitting: Physician Assistant

## 2018-07-13 DIAGNOSIS — I472 Ventricular tachycardia: Secondary | ICD-10-CM

## 2018-07-13 DIAGNOSIS — I480 Paroxysmal atrial fibrillation: Secondary | ICD-10-CM

## 2018-07-13 DIAGNOSIS — I4729 Other ventricular tachycardia: Secondary | ICD-10-CM

## 2018-07-13 NOTE — Telephone Encounter (Addendum)
Amanda from [employer] called regarding notifictation of important transmission. Pt seen 07/10/18 by Ermalinda Barrios with tachypalps different than prior Afib. Monitor placed. Per [employer], pt had episode of atrial fib at 14:06 EST at 81bpm lasting 23 seconds.  Spoke with patient - was feeling palpitations. Has also been feeling them occasionally outside of this episode but also has h/o freq PACs so difficult to tell. His HR when in normal rhythm runs 66-70s per his report. He otherwise feels just fine, just notices intermittent palps which catch his attention. No CP, SOB, syncope, dizziness. CHADSVASC listed as 1 in both [place] and Dr. Olin Pia note. He refutes prior dx of HTN mentioned in notes. BP mostly controlled but occasional outlier above 984 systolic. Will need to clarify with primary cardiologist because if he is felt to have hypertension, CHADSVASC would be 2. Discussed with DOD Dr. Sallyanne Kuster - nothing acutely to do given limited duration of episode but will forward to Dr. Caryl Comes for his review. The patient and I did discuss that he could try a trial of the Lopressor he was given (prescribed PRN), taking it instead in the form of 12.5mg  BID. He will consider this.  Opal Dinning PA-C

## 2018-07-14 ENCOUNTER — Telehealth: Payer: Self-pay | Admitting: Physician Assistant

## 2018-07-14 NOTE — Telephone Encounter (Signed)
See other phone note

## 2018-07-14 NOTE — Telephone Encounter (Signed)
[employer] called again today. Shortly after 9am had 14 beats of NSVT. They were unable to reach pt but I did only after leaving messages with him and his wife. He was at church. Pt reports he had several palpitations overnight so started metoprolol last night as discussed (12.5mg  BID). He overall felt much better but this morning felt an odd weak sensation around the time that NSVT occurred but did not feel palpitations. Otherwise no CP, syncope, SOB and feeling well. I d/w Dr. Curt Bears on call for EP who suggested increasing metoprolol to 25mg  BID for now; will forward to Dr. Caryl Comes and Selinda Eon to review as well. May need further updating of labs/echo depending on their input. Er precautions reviewed. Pt verbalized understanding and gratitude.  Grecia Lynk PA-C

## 2018-07-15 MED ORDER — METOPROLOL TARTRATE 25 MG PO TABS: 25.0000 mg | ORAL_TABLET | Freq: Two times a day (BID) | ORAL | 0 refills | Status: DC

## 2018-07-15 NOTE — Telephone Encounter (Signed)
Please schedule this patient for an echo with all his NSVT and make sure Dr. Adela Glimpse his NSVT on monitor. Thanks,

## 2018-07-15 NOTE — Telephone Encounter (Signed)
Called and spoke with patient. Patient states that he has been taking the metoprolol 25 mg BID as previously instructed. Med list updated. Made patient aware that Ermalinda Barrios, PA would like for him to have an echocardiogram done. Patient agrees with plan. Appointment for echocardiogram made for 07/17/18 at 7:30 AM. Dr. Caryl Comes is on vacation and will not be back in the office until 11/12. Will have him review monitor strips at that time.

## 2018-07-15 NOTE — Addendum Note (Signed)
Addended by: Drue Novel I on: 07/15/2018 10:21 AM   Modules accepted: Orders

## 2018-07-17 ENCOUNTER — Other Ambulatory Visit: Payer: Self-pay

## 2018-07-17 ENCOUNTER — Ambulatory Visit (HOSPITAL_COMMUNITY): Payer: Medicare Other | Attending: Cardiology

## 2018-07-17 ENCOUNTER — Telehealth: Payer: Self-pay | Admitting: Physician Assistant

## 2018-07-17 DIAGNOSIS — I472 Ventricular tachycardia: Secondary | ICD-10-CM

## 2018-07-17 DIAGNOSIS — I4729 Other ventricular tachycardia: Secondary | ICD-10-CM

## 2018-07-17 DIAGNOSIS — I480 Paroxysmal atrial fibrillation: Secondary | ICD-10-CM | POA: Diagnosis not present

## 2018-07-17 NOTE — Telephone Encounter (Signed)
New Message   Patient is calling to obtain results of the echocardiogram. Please call to discuss.

## 2018-07-17 NOTE — Telephone Encounter (Signed)
Pt advised echo results 

## 2018-07-17 NOTE — Telephone Encounter (Signed)
LMTCB

## 2018-07-22 ENCOUNTER — Ambulatory Visit: Payer: Medicare Other | Admitting: Nurse Practitioner

## 2018-07-31 NOTE — Telephone Encounter (Signed)
Shelly good am I am looking at this chart about a monitor   I dont see it in my basket But I see it on the reading work list but when I bring it up the report is not attached,  Is it still pending > Thanks steve

## 2018-07-31 NOTE — Telephone Encounter (Signed)
Good morning.   Monitor was applied 07/10/18.  Estimated date of completion will be 08/10/18.  Results have not been imported or assigned as of yet.  Do you need to see recordings to date? Thanks, [employer]

## 2018-08-09 DIAGNOSIS — I480 Paroxysmal atrial fibrillation: Secondary | ICD-10-CM | POA: Diagnosis not present

## 2018-08-29 ENCOUNTER — Ambulatory Visit (INDEPENDENT_AMBULATORY_CARE_PROVIDER_SITE_OTHER): Payer: Medicare Other | Admitting: Internal Medicine

## 2018-08-29 ENCOUNTER — Encounter: Payer: Self-pay | Admitting: Internal Medicine

## 2018-08-29 VITALS — BP 124/86 | HR 65 | Ht 68.5 in | Wt 194.8 lb

## 2018-08-29 DIAGNOSIS — I472 Ventricular tachycardia, unspecified: Secondary | ICD-10-CM

## 2018-08-29 DIAGNOSIS — I480 Paroxysmal atrial fibrillation: Secondary | ICD-10-CM | POA: Diagnosis not present

## 2018-08-29 DIAGNOSIS — I10 Essential (primary) hypertension: Secondary | ICD-10-CM

## 2018-08-29 DIAGNOSIS — I491 Atrial premature depolarization: Secondary | ICD-10-CM | POA: Diagnosis not present

## 2018-08-29 NOTE — Progress Notes (Signed)
Patient Care Team: [name], DO as PCP - General (Family Medicine) Deboraha Sprang, MD as Consulting Physician (Cardiology)   HPI  Marc Benson is a 67 y.o. male Seen in followup for paroxysmal atrial fibrillation. He has undergone pulmonary vein isolation.  X 2   Saw ML-PA 10/19 for palpitations   He was started on metoprolol.  This is been associated with fatigue and impotence.    Also, his Crestor is going to be discontinued because of pricing from his insurance company he has been put on atorvastatin which she has tolerated poorly in the past.    DATE TEST EF   11/19 Echo   50-55 % Ao V thickened and Calcified  (NO Ao V grad reported         Event Recorder personnally reviewed  :: afib < 2 hrs with total burden 3 % PVC 1 % PACs 6 % -all of his atrial fibrillation occurred on 2 days.  His CHADS-VASc score is 1    Past Medical History:  Diagnosis Date  . ANXIETY 05/14/2009  . PAROXYSMAL ATRIAL FIBRILLATION 05/14/2009   s/p PVI x 2 [facility]  . PREMATURE VENTRICULAR CONTRACTIONS 05/14/2009  . SLEEP DISORDER, HX OF 05/14/2009    Past Surgical History:  Procedure Laterality Date  . ABLATION    . CARDIAC CATHETERIZATION  11/18/2004      Current Outpatient Medications  Medication Sig Dispense Refill  . ALPRAZolam (XANAX) 1 MG tablet Take 1 mg by mouth at bedtime as needed for anxiety or sleep.     Marland Kitchen levothyroxine (SYNTHROID, LEVOTHROID) 50 MCG tablet Take 50 mcg by mouth daily.     . metoprolol tartrate (LOPRESSOR) 25 MG tablet Take 12.5 mg by mouth daily.    . nortriptyline (PAMELOR) 10 MG capsule Take 10 mg by mouth at bedtime.     . rosuvastatin (CRESTOR) 10 MG tablet Take 10 mg by mouth daily.     No current facility-administered medications for this visit.     Allergies  Allergen Reactions  . Latex Itching and Rash    "LATEX TAPE"  . Benadryl [Diphenhydramine Hcl]     Review of Systems negative except from HPI and PMH  Physical Exam BP 124/86    Pulse 65   Ht 5' 8.5" (1.74 m)   Wt 194 lb 12.8 oz (88.4 kg)   SpO2 97%   BMI 29.19 kg/m   Well developed and nourished in no acute distress HENT normal Neck supple with JVP-flat Clear Regular rate and rhythm, no murmurs or gallops Abd-soft with active BS No Clubbing cyanosis edema Skin-warm and dry A & Oriented  Grossly normal sensory and motor function\    ECG demonstrates   sinus at 65 beats interval 15/10/43 Right axis at 114 Infrequent PACs  Assessment and  Plan  Atrial fibrillation s/p PVI x2  PACs -frequent  Erectile dysfunction  Anxiety    Overall he is weathered this relatively well.  We will discontinue his metoprolol as it is temporally associated with both fatigue and erectile dysfunction.  His atrial fibrillation is sporadic and so we will have him use his metoprolol on an as-needed basis.  Has suggested that he is going to go on atorvastatin that he tried a couple of days a week and see if he can tolerate that.  He is to get his lipids checked prior to that we will look at his ten-year risk.  May be that we can  use calcium scoring to rethink his need for statin therapy  We spent more than 50% of our >25 min visit in face to face counseling regarding the above

## 2018-08-29 NOTE — Patient Instructions (Addendum)
Medication Instructions:  Your physician has recommended you make the following change in your medication:   1. Stop Metoprolol 2. Stop Crestor  Labwork: Please follow up with your PCP regarding your lipids since stopping crestor.  Testing/Procedures: Your physician has requested that you have cardiac CT. Cardiac computed tomography (CT) is a painless test that uses an x-ray machine to take clear, detailed pictures of your heart. For further information please visit [email or web address][employer]tn. Please follow instruction sheet as given.   Follow-Up: Your physician recommends that you schedule a follow-up appointment in:   3 months with Dr Caryl Comes    Any Other Special Instructions Will Be Listed Below (If Applicable).  Please arrive at the [facility] main entrance of [facility] at ______________ AM (30-45 minutes prior to test start time)  [facility] [address] [place], [place] [address] [phone]  Proceed to the [facility] Radiology Department (First Floor).  Please follow these instructions carefully (unless otherwise directed):  Hold all erectile dysfunction medications at least 48 hours prior to test.  On the Night Before the Test: . Be sure to Drink plenty of water. . Do not consume any caffeinated/decaffeinated beverages or chocolate 12 hours prior to your test. . Do not take any antihistamines 12 hours prior to your test.  On the Day of the Test: . Drink plenty of water. Do not drink any water within one hour of the test. . Do not eat any food 4 hours prior to the test. . You may take your regular medications prior to the test.  . Take metoprolol (Lopressor), 50mg , two hours prior to test.     After the Test: . Drink plenty of water. . After receiving IV contrast, you may experience a mild flushed feeling. This is normal. . On occasion, you may experience a mild rash up to 24 hours after the test. This is not dangerous. If this occurs, you  can take Benadryl 25 mg and increase your fluid intake. . If you experience trouble breathing, this can be serious. If it is severe call 911 IMMEDIATELY. If it is mild, please call our office.  If you need a refill on your cardiac medications before your next appointment, please call your pharmacy.

## 2018-08-30 LAB — BASIC METABOLIC PANEL
BUN/Creatinine Ratio: 12 (ref 10–24)
BUN: 12 mg/dL (ref 8–27)
CO2: 25 mmol/L (ref 20–29)
Calcium: 9.4 mg/dL (ref 8.6–10.2)
Chloride: 100 mmol/L (ref 96–106)
Creatinine, Ser: 0.98 mg/dL (ref 0.76–1.27)
GFR calc Af Amer: 91 mL/min/{1.73_m2} (ref >59)
GFR calc non Af Amer: 79 mL/min/{1.73_m2} (ref >59)
Glucose: 88 mg/dL (ref 65–99)
Potassium: 4.5 mmol/L (ref 3.5–5.2)
Sodium: 142 mmol/L (ref 134–144)

## 2018-09-25 ENCOUNTER — Telehealth (HOSPITAL_COMMUNITY): Payer: Self-pay | Admitting: Emergency Medicine

## 2018-09-25 NOTE — Telephone Encounter (Signed)
Left message on voicemail with name and callback number Viggo Perko RN Navigator Cardiac Imaging [phone] 

## 2018-09-25 NOTE — Telephone Encounter (Signed)
Patient returning phone call -- Reaching out to patient to offer assistance regarding upcoming cardiac imaging study; pt verbalizes understanding of appt date/time, parking situation and where to check in, pre-test NPO status and medications ordered, and verified current allergies; name and call back number provided for further questions should they arise Marchia Bond RN Navigator Cardiac Imaging [phone]

## 2018-09-26 ENCOUNTER — Ambulatory Visit (HOSPITAL_COMMUNITY)
Admission: RE | Admit: 2018-09-26 | Discharge: 2018-09-26 | Disposition: A | Discharge status: Home / Self Care | Payer: Medicare Other | Source: Ambulatory Visit | Attending: Internal Medicine | Admitting: Internal Medicine

## 2018-09-26 ENCOUNTER — Ambulatory Visit (HOSPITAL_COMMUNITY): Admission: RE | Admit: 2018-09-26 | Payer: Medicare Other | Source: Ambulatory Visit

## 2018-09-26 DIAGNOSIS — I472 Ventricular tachycardia, unspecified: Secondary | ICD-10-CM

## 2018-09-26 DIAGNOSIS — I480 Paroxysmal atrial fibrillation: Secondary | ICD-10-CM | POA: Diagnosis not present

## 2018-09-26 DIAGNOSIS — I700 Atherosclerosis of aorta: Secondary | ICD-10-CM | POA: Diagnosis not present

## 2018-09-26 MED ORDER — NITROGLYCERIN 0.4 MG SL SUBL
SUBLINGUAL_TABLET | SUBLINGUAL | Status: AC
  Filled 2018-09-26: qty 2

## 2018-09-26 MED ORDER — IOPAMIDOL (ISOVUE-300) INJECTION 61%
100.0000 mL | Freq: Once | INTRAVENOUS | Status: AC | PRN
  Administered 2018-09-26: 80 mL via INTRAVENOUS

## 2018-09-26 MED ORDER — NITROGLYCERIN 0.4 MG SL SUBL
0.8000 mg | SUBLINGUAL_TABLET | Freq: Once | SUBLINGUAL | Status: AC
  Administered 2018-09-26: 0.8 mg via SUBLINGUAL
  Filled 2018-09-26: qty 25

## 2018-09-30 ENCOUNTER — Telehealth: Payer: Self-pay | Admitting: Internal Medicine

## 2018-09-30 NOTE — Telephone Encounter (Signed)
Spoke with pt and asked him to disregard his "No Show" notice from his CT appointment. I also advised pt that Dr Caryl Comes had not yet reviewed his CT results and we would be calling him later this week. Pt has verbalized understanding and had no additional questions.

## 2018-09-30 NOTE — Telephone Encounter (Signed)
New Message         Patient is calling today to get a correction that was sent to his M-chart. It states that he had a no show on 09/26/2018. Patient did make that CT appt. Along with the correction patient would like his results. Pls call and advise.

## 2018-10-01 ENCOUNTER — Other Ambulatory Visit: Payer: Medicare Other

## 2018-10-02 ENCOUNTER — Telehealth: Payer: Self-pay

## 2018-10-02 MED ORDER — ATORVASTATIN CALCIUM 40 MG PO TABS: 40.0000 mg | ORAL_TABLET | Freq: Every day | ORAL | 3 refills | Status: DC

## 2018-10-02 NOTE — Telephone Encounter (Signed)
-----   Message from Deboraha Sprang, MD sent at 09/28/2018  1:29 PM EST ----- CT is abnormal demonstrating Ca BUT NO obstructive CAD   Continue statin-- would increase 10>>20   Thanks

## 2018-10-02 NOTE — Telephone Encounter (Signed)
Per Dr Caryl Comes, he would like pt to begin 40mg  Atorvastatin qd. Pt states he has 20mg  on hand, he was advised to take two 20mg  tablets until his current supply has been used. He then will have refills available for 40mg  tablets.   Pt has agreed with plan and had no additional questions.

## 2018-10-24 DIAGNOSIS — R3912 Poor urinary stream: Secondary | ICD-10-CM | POA: Diagnosis not present

## 2018-10-24 DIAGNOSIS — N528 Other male erectile dysfunction: Secondary | ICD-10-CM | POA: Diagnosis not present

## 2018-10-24 DIAGNOSIS — N50811 Right testicular pain: Secondary | ICD-10-CM | POA: Diagnosis not present

## 2018-10-24 DIAGNOSIS — N401 Enlarged prostate with lower urinary tract symptoms: Secondary | ICD-10-CM | POA: Diagnosis not present

## 2018-10-24 DIAGNOSIS — R3915 Urgency of urination: Secondary | ICD-10-CM | POA: Diagnosis not present

## 2018-10-28 ENCOUNTER — Encounter: Payer: Self-pay | Admitting: Internal Medicine

## 2018-11-04 ENCOUNTER — Telehealth: Payer: Self-pay | Admitting: Internal Medicine

## 2018-11-04 NOTE — Telephone Encounter (Signed)
New message   Patient would like an order put in for lab work. Please call to discuss.

## 2018-11-04 NOTE — Telephone Encounter (Signed)
Pt calling today to determine if he needs lipids drawn since Dr Caryl Comes increased his dose of lipitor. I advised pt according to Dr Olin Pia last OV note, he was going to defer lipid management to pt's primary care physician. I offered to place an order for lipids/LFT's, but pt has decided to call his PCP to have them drawn ahead of time. Pt will have his PCP send [place] his new labs prior to his upcoming appointment.

## 2018-11-25 ENCOUNTER — Telehealth: Payer: Self-pay | Admitting: Internal Medicine

## 2018-11-25 ENCOUNTER — Ambulatory Visit: Payer: Medicare Other | Admitting: Internal Medicine

## 2018-11-25 MED ORDER — DILTIAZEM HCL 30 MG PO TABS: 30.0000 mg | ORAL_TABLET | ORAL | 0 refills | Status: DC | PRN

## 2018-11-25 NOTE — Telephone Encounter (Signed)
Called to speak with patient re palpitations  Apple Watch >> HR 180 bpm fast/regular for 10 min; LH.  reminsicinet of 10/18 AFib yday acc to AW for about 10-15 bpm, HR 63-102  Will RX cardiazem 30 mg prn  Will reschedule in 3 months.

## 2018-12-04 DIAGNOSIS — N528 Other male erectile dysfunction: Secondary | ICD-10-CM | POA: Diagnosis not present

## 2018-12-04 DIAGNOSIS — N5314 Retrograde ejaculation: Secondary | ICD-10-CM | POA: Diagnosis not present

## 2018-12-04 DIAGNOSIS — N401 Enlarged prostate with lower urinary tract symptoms: Secondary | ICD-10-CM | POA: Diagnosis not present

## 2018-12-04 DIAGNOSIS — R3912 Poor urinary stream: Secondary | ICD-10-CM | POA: Diagnosis not present

## 2018-12-04 DIAGNOSIS — N50811 Right testicular pain: Secondary | ICD-10-CM | POA: Diagnosis not present

## 2018-12-11 DIAGNOSIS — E782 Mixed hyperlipidemia: Secondary | ICD-10-CM | POA: Diagnosis not present

## 2018-12-11 DIAGNOSIS — Z79899 Other long term (current) drug therapy: Secondary | ICD-10-CM | POA: Diagnosis not present

## 2019-01-07 ENCOUNTER — Telehealth: Payer: Self-pay | Admitting: Internal Medicine

## 2019-01-07 NOTE — Telephone Encounter (Signed)
Spoke with pt and pt noted another episode of fast heart rate and  with this episode lasted longer than normal started at 6:00 pm and returned to normal somewhat around 7:00 pm Pt did take the Diltiazem 30 mg  As directed .Will forward to Dr Caryl Comes for review .Adonis Housekeeper

## 2019-01-07 NOTE — Telephone Encounter (Signed)
New Message           Patient states he had a "Tachacardio" yesterday. He had heart beats of  165/ to 175 per mintue , then it calmed down and went back to normal by the time he went to bed.  Patient is concern an would like a call back. Pls call and advise.

## 2019-01-10 NOTE — Telephone Encounter (Signed)
Melissa can you see if we can do Telehealth visit    Next Wednesday

## 2019-01-14 NOTE — Telephone Encounter (Signed)
Called and spoke with patient, telehealth visit scheduled for 01/15/19 at 11:30.      Virtual Visit Pre-Appointment Phone Call  "(Name), I am calling you today to discuss your upcoming appointment. We are currently trying to limit exposure to the virus that causes COVID-19 by seeing patients at home rather than in the office."  1. "What is the BEST phone number to call the day of the visit?" - include this in appointment notes  2. "Do you have or have access to (through a family member/friend) a smartphone with video capability that we can use for your visit?" a. If yes - list this number in appt notes as "cell" (if different from BEST phone #) and list the appointment type as a VIDEO visit in appointment notes b. If no - list the appointment type as a PHONE visit in appointment notes  3. Confirm consent - "In the setting of the current Covid19 crisis, you are scheduled for a (phone or video) visit with your provider on (date) at (time).  Just as we do with many in-office visits, in order for you to participate in this visit, we must obtain consent.  If you'd like, I can send this to your mychart (if signed up) or email for you to review.  Otherwise, I can obtain your verbal consent now.  All virtual visits are billed to your insurance company just like a normal visit would be.  By agreeing to a virtual visit, we'd like you to understand that the technology does not allow for your provider to perform an examination, and thus may limit your provider's ability to fully assess your condition. If your provider identifies any concerns that need to be evaluated in person, we will make arrangements to do so.  Finally, though the technology is pretty good, we cannot assure that it will always work on either your or our end, and in the setting of a video visit, we may have to convert it to a phone-only visit.  In either situation, we cannot ensure that we have a secure connection.  Are you willing to proceed?"  STAFF: Did the patient verbally acknowledge consent to telehealth visit? Document YES/NO here: YES  4. Advise patient to be prepared - "Two hours prior to your appointment, go ahead and check your blood pressure, pulse, oxygen saturation, and your weight (if you have the equipment to check those) and write them all down. When your visit starts, your provider will ask you for this information. If you have an Apple Watch or Kardia device, please plan to have heart rate information ready on the day of your appointment. Please have a pen and paper handy nearby the day of the visit as well."  5. Give patient instructions for MyChart download to smartphone OR Doximity/Doxy.me as below if video visit (depending on what platform provider is using)  6. Inform patient they will receive a phone call 15 minutes prior to their appointment time (may be from unknown caller ID) so they should be prepared to answer    TELEPHONE CALL NOTE  Marc Benson has been deemed a candidate for a follow-up tele-health visit to limit community exposure during the Covid-19 pandemic. I spoke with the patient via phone to ensure availability of phone/video source, confirm preferred email & phone number, and discuss instructions and expectations.  I reminded Marc Benson to be prepared with any vital sign and/or heart rhythm information that could potentially be obtained via home monitoring, at the time  of his visit. I reminded Marc Benson to expect a phone call prior to his visit.  [name], [place] 01/14/2019 9:54 AM   INSTRUCTIONS FOR DOWNLOADING THE MYCHART APP TO SMARTPHONE  - The patient must first make sure to have activated MyChart and know their login information - If Apple, go to [employer] and type in MyChart in the search bar and download the app. If Android, ask patient to go to [employer] and type in [place] in the search bar and download the app. The app is free but as with any other app downloads,  their phone may require them to verify saved payment information or Apple/Android password.  - The patient will need to then log into the app with their MyChart username and password, and select [facility] as their healthcare provider to link the account. When it is time for your visit, go to the MyChart app, find appointments, and click Begin Video Visit. Be sure to Select Allow for your device to access the Microphone and Camera for your visit. You will then be connected, and your provider will be with you shortly.  **If they have any issues connecting, or need assistance please contact MyChart service desk [phone])**  **If using a computer, in order to ensure the best quality for their visit they will need to use either of the following Internet Browsers: [employer], or Google Chrome**  IF USING DOXIMITY or DOXY.ME - The patient will receive a link just prior to their visit by text.     FULL LENGTH CONSENT FOR TELE-HEALTH VISIT   I hereby voluntarily request, consent and authorize [facility] and its employed or contracted physicians, physician assistants, nurse practitioners or other licensed health care professionals (the Practitioner), to provide me with telemedicine health care services (the "Services") as deemed necessary by the treating Practitioner. I acknowledge and consent to receive the Services by the Practitioner via telemedicine. I understand that the telemedicine visit will involve communicating with the Practitioner through live audiovisual communication technology and the disclosure of certain medical information by electronic transmission. I acknowledge that I have been given the opportunity to request an in-person assessment or other available alternative prior to the telemedicine visit and am voluntarily participating in the telemedicine visit.  I understand that I have the right to withhold or withdraw my consent to the use of telemedicine in the  course of my care at any time, without affecting my right to future care or treatment, and that the Practitioner or I may terminate the telemedicine visit at any time. I understand that I have the right to inspect all information obtained and/or recorded in the course of the telemedicine visit and may receive copies of available information for a reasonable fee.  I understand that some of the potential risks of receiving the Services via telemedicine include:  Marland Kitchen Delay or interruption in medical evaluation due to technological equipment failure or disruption; . Information transmitted may not be sufficient (e.g. poor resolution of images) to allow for appropriate medical decision making by the Practitioner; and/or  . In rare instances, security protocols could fail, causing a breach of personal health information.  Furthermore, I acknowledge that it is my responsibility to provide information about my medical history, conditions and care that is complete and accurate to the best of my ability. I acknowledge that Practitioner's advice, recommendations, and/or decision may be based on factors not within their control, such as incomplete or inaccurate data provided  by me or distortions of diagnostic images or specimens that may result from electronic transmissions. I understand that the practice of medicine is not an exact science and that Practitioner makes no warranties or guarantees regarding treatment outcomes. I acknowledge that I will receive a copy of this consent concurrently upon execution via email to the email address I last provided but may also request a printed copy by calling the office of [facility].    I understand that my insurance will be billed for this visit.   I have read or had this consent read to me. . I understand the contents of this consent, which adequately explains the benefits and risks of the Services being provided via telemedicine.  . I have been provided ample  opportunity to ask questions regarding this consent and the Services and have had my questions answered to my satisfaction. . I give my informed consent for the services to be provided through the use of telemedicine in my medical care  By participating in this telemedicine visit I agree to the above.

## 2019-01-15 ENCOUNTER — Encounter: Payer: Self-pay | Admitting: Internal Medicine

## 2019-01-15 ENCOUNTER — Telehealth (INDEPENDENT_AMBULATORY_CARE_PROVIDER_SITE_OTHER): Payer: Medicare Other | Admitting: Internal Medicine

## 2019-01-15 ENCOUNTER — Telehealth: Payer: Self-pay | Admitting: Internal Medicine

## 2019-01-15 ENCOUNTER — Other Ambulatory Visit: Payer: Self-pay

## 2019-01-15 VITALS — Ht 68.5 in | Wt 186.0 lb

## 2019-01-15 DIAGNOSIS — I491 Atrial premature depolarization: Secondary | ICD-10-CM

## 2019-01-15 DIAGNOSIS — I480 Paroxysmal atrial fibrillation: Secondary | ICD-10-CM | POA: Diagnosis not present

## 2019-01-15 DIAGNOSIS — I472 Ventricular tachycardia: Secondary | ICD-10-CM

## 2019-01-15 DIAGNOSIS — I4729 Other ventricular tachycardia: Secondary | ICD-10-CM

## 2019-01-15 NOTE — Telephone Encounter (Signed)
New message  Patient has not received the link for his virtual appt. Please contact the patient.

## 2019-01-15 NOTE — Progress Notes (Signed)
Electrophysiology TeleHealth Note   Due to national recommendations of social distancing due to COVID 19, an audio/video telehealth visit is felt to be most appropriate for this patient at this time.  See MyChart message from today for the patient's consent to telehealth for [facility].   Date:  01/15/2019   ID:  Marc, Benson Oct 31, 1950, MRN [id number]  Location: patient's home  Provider location: [address], [place]  Evaluation Performed: Follow-up visit  PCP:  [name], DO  Cardiologist:     Electrophysiologist:  SK   Chief Complaint:   Tachypalpitations  History of Present Illness:    Marc Benson is a 68 y.o. male who presents via audio/video conferencing for a telehealth visit today.  Since last being seen in our clinic for  atrial fibrillation palpitations  the patient reports feels good x with recurrent tachypalpitations   specifically he denies SOB, chest pain edema  There has been no syncope or presyncope.  Occurs mostly at night and is recorded by his Apple watch 4/21 had an evening episode>> mostly regular HR >>175-180-- duration about 1  hrs  And not responded diltiazem   Assoc with SOB   He has a history of atrial fibrillation with prior PVI 2.  10/19 event recorder for palpitations demonstrated recurrent tachycardia notably both SVT as well as ventricular tachycardia-nonsustained-14 beats  11/19 echocardiogram normal LV function (50-55%) Ao V thickening   1/20 CTA calcium score 313 nonobstructive disease-on statins  The patient denies symptoms of fevers, chills, cough, or new SOB worrisome for COVID 19.   Past Medical History:  Diagnosis Date  . ANXIETY 05/14/2009  . PAROXYSMAL ATRIAL FIBRILLATION 05/14/2009   s/p PVI x 2 [facility]  . PREMATURE VENTRICULAR CONTRACTIONS 05/14/2009  . SLEEP DISORDER, HX OF 05/14/2009    Past Surgical History:  Procedure Laterality Date  . ABLATION    . CARDIAC CATHETERIZATION  11/18/2004       Current Outpatient Medications  Medication Sig Dispense Refill  . ALPRAZolam (XANAX) 1 MG tablet Take 1 mg by mouth at bedtime as needed for anxiety or sleep.     Marland Kitchen atorvastatin (LIPITOR) 40 MG tablet Take 1 tablet (40 mg total) by mouth daily. 90 tablet 3  . diltiazem (CARDIZEM) 30 MG tablet Take 1 tablet (30 mg total) by mouth as needed for up to 1 dose (persistent palpitations). 10 tablet 0  . levothyroxine (SYNTHROID, LEVOTHROID) 50 MCG tablet Take 50 mcg by mouth daily.     . nortriptyline (PAMELOR) 10 MG capsule Take 10 mg by mouth at bedtime.     . tamsulosin (FLOMAX) 0.4 MG CAPS capsule Take 0.4 mg by mouth daily.     No current facility-administered medications for this visit.     Allergies:   Latex and Benadryl [diphenhydramine hcl]   Social History:  The patient  reports that he quit smoking about 47 years ago. He has never used smokeless tobacco. He reports that he does not drink alcohol or use drugs.   Family History:  The patient's   family history includes Aneurysm in his mother; Heart attack in his father.   ROS:  Please see the history of present illness.   All other systems are personally reviewed and negative.    Exam:    Vital Signs:  Ht 5' 8.5" (1.74 m)   Wt 186 lb (84.4 kg)   BMI 27.87 kg/m     Well appearing, alert and  conversant, regular work of breathing,  good skin color Eyes- anicteric, neuro- grossly intact, skin- no apparent rash or lesions or cyanosis, mouth- oral mucosa is pink   Labs/Other Tests and Data Reviewed:    Recent Labs: 07/10/2018: ALT 21; Hemoglobin 13.8; Platelets 253 08/29/2018: BUN 12; Creatinine, Ser 0.98; Potassium 4.5; Sodium 142   Wt Readings from Last 3 Encounters:  01/15/19 186 lb (84.4 kg)  08/29/18 194 lb 12.8 oz (88.4 kg)  07/10/18 192 lb 12.8 oz (87.5 kg)     Other studies personally reviewed: Additional studies/ records that were reviewed today include:    Review of the above records today demonstrates:   Prior  radiographs:      ASSESSMENT & PLAN:    Atrial fibrillation s/p PVI x2  Ventricular tachycardia-nonsustained  Tachycardia regular    PACs -frequent  Erectile dysfunction  Anxiety    Recurrent tachycardia, somewhat regular.  Suspect an atypical flutter.  Presumably at EP testing x2 no substrate was found for an AV nodal dependent reentrant SVT.  He had undergone CTA following the demonstration of ventricular tachycardia-nonsustained; mild non-obstructive coronary disease was noted.  As noted above LV function was normal and echo.  This makes sustained ventricular tachycardia quite unlikely.  Have reached out to AS/JA regarding the capabilities of apple watch 5 to record a true heart rhythm.  We need to make a distinction between SVT and VT  Suggested that in the event of recurrence you can take diltiazem 30 mg q. 1 hour x 3 and let his symptoms dictate the need to go to the hospital  Reviewed the ASSERT data related to SCAF and duration of atrial fibrillation risk of thromboembolism   COVID 19 screen The patient denies symptoms of COVID 19 at this time.  The importance of social distancing was discussed today.  Follow-up:       Current medicines are reviewed at length with the patient today.   The patient does not have concerns regarding his medicines.  The following changes were made today:  none  Labs/ tests ordered today include:   No orders of the defined types were placed in this encounter.   Future tests ( post COVID )  Plan  Patient Risk:  after full review of this patients clinical status, I feel that they are at moderate  risk at this time.  Today, I have spent 19* minutes with the patient with telehealth technology discussing the above.  Signed, Marc Axe, MD  01/15/2019 12:06 PM     [facility] [address] [place] [address] [phone] (office) [phone] (fax)

## 2019-01-15 NOTE — Telephone Encounter (Signed)
I am on the phone with patient now, walking him through setting up virtual visit. Link has been sent.

## 2019-03-06 DIAGNOSIS — L812 Freckles: Secondary | ICD-10-CM | POA: Diagnosis not present

## 2019-03-06 DIAGNOSIS — L218 Other seborrheic dermatitis: Secondary | ICD-10-CM | POA: Diagnosis not present

## 2019-03-06 DIAGNOSIS — D1801 Hemangioma of skin and subcutaneous tissue: Secondary | ICD-10-CM | POA: Diagnosis not present

## 2019-03-06 DIAGNOSIS — L821 Other seborrheic keratosis: Secondary | ICD-10-CM | POA: Diagnosis not present

## 2019-03-06 DIAGNOSIS — L813 Cafe au lait spots: Secondary | ICD-10-CM | POA: Diagnosis not present

## 2019-04-28 ENCOUNTER — Encounter: Payer: Self-pay | Admitting: Internal Medicine

## 2019-04-28 ENCOUNTER — Other Ambulatory Visit: Payer: Self-pay

## 2019-04-28 ENCOUNTER — Ambulatory Visit (INDEPENDENT_AMBULATORY_CARE_PROVIDER_SITE_OTHER): Payer: Medicare Other | Admitting: Internal Medicine

## 2019-04-28 VITALS — BP 132/80 | HR 86 | Ht 68.5 in | Wt 191.2 lb

## 2019-04-28 DIAGNOSIS — I480 Paroxysmal atrial fibrillation: Secondary | ICD-10-CM

## 2019-04-28 DIAGNOSIS — I493 Ventricular premature depolarization: Secondary | ICD-10-CM

## 2019-04-28 DIAGNOSIS — I472 Ventricular tachycardia, unspecified: Secondary | ICD-10-CM

## 2019-04-28 DIAGNOSIS — I491 Atrial premature depolarization: Secondary | ICD-10-CM | POA: Diagnosis not present

## 2019-04-28 NOTE — Progress Notes (Signed)
Patient Care Team: Orpah Melter, MD as PCP - General (Family Medicine) Deboraha Sprang, MD as Consulting Physician (Cardiology)   HPI  Marc Benson is a 68 y.o. male Seen in followup for paroxysmal atrial fibrillation. He has undergone pulmonary vein isolation.  X 2   Saw [facility]-PA 10/19 for palpitations   He was started on metoprolol.  This is been associated with fatigue and impotence.    Also, his Crestor is going to be discontinued because of pricing from his insurance company; he has been put on atorvastatin which he has tolerated poorly in the past.    DATE TEST EF   11/19 Echo   50-55 % Ao V thickened and Calcified  (NO Ao V grad reported         Event Recorder personnally reviewed  :: afib < 2 hrs with total burden 3 % PVC 1 % PACs 6 % -all of his atrial fibrillation occurred on 2 days.  In 4-5/20 had flurry of arrhythmia detected by his Apple watch now largely [name] [facility] and doing well without The patient denies chest pain, shortness of breath, nocturnal dyspnea, orthopnea or peripheral edema.  There have been no lightheadedness or syncope.    His CHADS-VASc score is 1    Past Medical History:  Diagnosis Date  . ANXIETY 05/14/2009  . PAROXYSMAL ATRIAL FIBRILLATION 05/14/2009   s/p PVI x 2 [facility]  . PREMATURE VENTRICULAR CONTRACTIONS 05/14/2009  . SLEEP DISORDER, HX OF 05/14/2009    Past Surgical History:  Procedure Laterality Date  . ABLATION    . CARDIAC CATHETERIZATION  11/18/2004      Current Outpatient Medications  Medication Sig Dispense Refill  . ALPRAZolam (XANAX) 1 MG tablet Take 1 mg by mouth at bedtime as needed for anxiety or sleep.     Marland Kitchen atorvastatin (LIPITOR) 40 MG tablet Take 1 tablet (40 mg total) by mouth daily. 90 tablet 3  . diltiazem (CARDIZEM) 30 MG tablet Take 1 tablet (30 mg total) by mouth as needed for up to 1 dose (persistent palpitations). 10 tablet 0  . levothyroxine (SYNTHROID, LEVOTHROID) 50 MCG tablet  Take 50 mcg by mouth daily.     . Magnesium 400 MG TABS Take 1 tablet by mouth daily.    . nortriptyline (PAMELOR) 10 MG capsule Take 10 mg by mouth at bedtime.     . tamsulosin (FLOMAX) 0.4 MG CAPS capsule Take 0.4 mg by mouth daily.     No current facility-administered medications for this visit.     Allergies  Allergen Reactions  . Latex Itching and Rash    "LATEX TAPE"  . Benadryl [Diphenhydramine Hcl]     Review of Systems negative except from HPI and PMH  Physical Exam BP 132/80   Pulse 86   Ht 5' 8.5" (1.74 m)   Wt 191 lb 3.2 oz (86.7 kg)   SpO2 97%   BMI 28.65 kg/m    Well developed and nourished in no acute distress HENT normal Neck supple with JVP-  flat   Clear Regular rate and rhythm, no murmurs or gallops Abd-soft with active BS No Clubbing cyanosis edema Skin-warm and dry A & Oriented  Grossly normal sensory and motor function  ECG sinus @ 86 14/10/38   Assessment and  Plan  Atrial fibrillation s/p PVI x2  PACs -frequent  Anxiety  Cor Artery Calcification ( Score 313)     Largely quiescent  Will continue to follow No changes

## 2019-04-28 NOTE — Patient Instructions (Signed)

## 2019-04-29 DIAGNOSIS — E039 Hypothyroidism, unspecified: Secondary | ICD-10-CM | POA: Diagnosis not present

## 2019-04-29 DIAGNOSIS — E782 Mixed hyperlipidemia: Secondary | ICD-10-CM | POA: Diagnosis not present

## 2019-04-29 DIAGNOSIS — F419 Anxiety disorder, unspecified: Secondary | ICD-10-CM | POA: Diagnosis not present

## 2019-05-15 ENCOUNTER — Other Ambulatory Visit: Payer: Self-pay | Admitting: Urology

## 2019-05-15 DIAGNOSIS — N401 Enlarged prostate with lower urinary tract symptoms: Secondary | ICD-10-CM | POA: Diagnosis not present

## 2019-05-15 DIAGNOSIS — N50811 Right testicular pain: Secondary | ICD-10-CM | POA: Diagnosis not present

## 2019-05-15 DIAGNOSIS — R3915 Urgency of urination: Secondary | ICD-10-CM | POA: Diagnosis not present

## 2019-05-26 DIAGNOSIS — H2513 Age-related nuclear cataract, bilateral: Secondary | ICD-10-CM | POA: Diagnosis not present

## 2019-05-26 DIAGNOSIS — H1851 Endothelial corneal dystrophy: Secondary | ICD-10-CM | POA: Diagnosis not present

## 2019-05-27 ENCOUNTER — Ambulatory Visit
Admission: RE | Admit: 2019-05-27 | Discharge: 2019-05-27 | Disposition: A | Discharge status: Home / Self Care | Payer: Medicare Other | Source: Ambulatory Visit | Attending: Urology | Admitting: Urology

## 2019-05-27 DIAGNOSIS — N50811 Right testicular pain: Secondary | ICD-10-CM | POA: Diagnosis not present

## 2019-06-03 DIAGNOSIS — Z0000 Encounter for general adult medical examination without abnormal findings: Secondary | ICD-10-CM | POA: Diagnosis not present

## 2019-06-03 DIAGNOSIS — Z125 Encounter for screening for malignant neoplasm of prostate: Secondary | ICD-10-CM | POA: Diagnosis not present

## 2019-06-03 DIAGNOSIS — E782 Mixed hyperlipidemia: Secondary | ICD-10-CM | POA: Diagnosis not present

## 2019-06-09 DIAGNOSIS — Z23 Encounter for immunization: Secondary | ICD-10-CM | POA: Diagnosis not present

## 2019-08-14 DIAGNOSIS — R3915 Urgency of urination: Secondary | ICD-10-CM | POA: Diagnosis not present

## 2019-08-14 DIAGNOSIS — N50811 Right testicular pain: Secondary | ICD-10-CM | POA: Diagnosis not present

## 2019-08-14 DIAGNOSIS — N401 Enlarged prostate with lower urinary tract symptoms: Secondary | ICD-10-CM | POA: Diagnosis not present

## 2019-08-15 DIAGNOSIS — U071 COVID-19: Secondary | ICD-10-CM | POA: Diagnosis not present

## 2019-10-08 DIAGNOSIS — I491 Atrial premature depolarization: Secondary | ICD-10-CM | POA: Insufficient documentation

## 2019-10-09 ENCOUNTER — Other Ambulatory Visit: Payer: Self-pay

## 2019-10-09 ENCOUNTER — Telehealth (INDEPENDENT_AMBULATORY_CARE_PROVIDER_SITE_OTHER): Payer: Medicare Other | Admitting: Internal Medicine

## 2019-10-09 VITALS — HR 78 | Wt 187.0 lb

## 2019-10-09 DIAGNOSIS — I480 Paroxysmal atrial fibrillation: Secondary | ICD-10-CM | POA: Diagnosis not present

## 2019-10-09 DIAGNOSIS — I491 Atrial premature depolarization: Secondary | ICD-10-CM | POA: Diagnosis not present

## 2019-10-09 NOTE — Progress Notes (Signed)
Electrophysiology TeleHealth Note   Due to national recommendations of social distancing due to COVID 19, an audio/video telehealth visit is felt to be most appropriate for this patient at this time.  See MyChart message from today for the patient's consent to telehealth for [facility].   Date:  10/09/2019   ID:  [name], [name] [birth year], MRN [id number]  Location: patient's home  Provider location: [address], [place]  Evaluation Performed: Follow-up visit  PCP:  Orpah Melter, MD  Cardiologist:     Electrophysiologist:  SK   Chief Complaint:   Tachypalpitations  History of Present Illness:    Marc Benson is a 69 y.o. male who presents via audio/video conferencing for a telehealth visit today.  Since last being seen in our clinic for  atrial fibrillation with PVI 2 palpitations with PACs  the patient reports feels good x with  Infrequent palpitations; no anticoagulation   Interval COVID--    10/19 event recorder for palpitations demonstrated recurrent tachycardia notably both SVT as well as ventricular tachycardia-nonsustained-14 beats  11/19 echocardiogram normal LV function (50-55%) Ao V thickening  1/20 CTA calcium score 313>>> nonobstructive disease-on statins  The patient denies chest pain, shortness of breath, nocturnal dyspnea, orthopnea or peripheral edema.  There have been no  lightheadedness or syncope.    The patient denies symptoms of fevers, chills, cough, or new SOB worrisome for COVID 19.   Past Medical History:  Diagnosis Date  . ANXIETY 05/14/2009  . PAROXYSMAL ATRIAL FIBRILLATION 05/14/2009   s/p PVI x 2 [facility]  . PREMATURE VENTRICULAR CONTRACTIONS 05/14/2009  . SLEEP DISORDER, HX OF 05/14/2009    Past Surgical History:  Procedure Laterality Date  . ABLATION    . CARDIAC CATHETERIZATION  11/18/2004      Current Outpatient Medications  Medication Sig Dispense Refill  . ALPRAZolam (XANAX) 1 MG tablet Take 1 mg by mouth at  bedtime as needed for anxiety or sleep.     Marland Kitchen atorvastatin (LIPITOR) 40 MG tablet Take 1 tablet (40 mg total) by mouth daily. 90 tablet 3  . diltiazem (CARDIZEM) 30 MG tablet Take 1 tablet (30 mg total) by mouth as needed for up to 1 dose (persistent palpitations). 10 tablet 0  . levothyroxine (SYNTHROID, LEVOTHROID) 50 MCG tablet Take 50 mcg by mouth daily.     . Magnesium 400 MG TABS Take 1 tablet by mouth daily.    . nortriptyline (PAMELOR) 10 MG capsule Take 10 mg by mouth at bedtime.     . tamsulosin (FLOMAX) 0.4 MG CAPS capsule Take 0.4 mg by mouth daily.     No current facility-administered medications for this visit.    Allergies:   Latex and Benadryl [diphenhydramine hcl]   Social History:  The patient  reports that he quit smoking about 48 years ago. He has never used smokeless tobacco. He reports that he does not drink alcohol or use drugs.   Family History:  The patient's   family history includes Aneurysm in his mother; Heart attack in his father.   ROS:  Please see the history of present illness.   All other systems are personally reviewed and negative.    Exam:    Vital Signs:  Pulse 78   Wt 187 lb (84.8 kg)   BMI 28.02 kg/m     Well appearing, alert and conversant, regular work of breathing,  good skin color Eyes- anicteric, neuro- grossly intact, skin- no apparent rash  or lesions or cyanosis, mouth- oral mucosa is pink   Labs/Other Tests and Data Reviewed:    Recent Labs: No results found for requested labs within last 8760 hours.   Wt Readings from Last 3 Encounters:  10/09/19 187 lb (84.8 kg)  04/28/19 191 lb 3.2 oz (86.7 kg)  01/15/19 186 lb (84.4 kg)     Other studies personally reviewed: Additional studies/ records that were reviewed today include:    Review of the above records today demonstrates:   Prior radiographs:      ASSESSMENT & PLAN:    Atrial fibrillation s/p PVI x2  Ventricular tachycardia-nonsustained  Tachycardia regular     PACs -frequent  Erectile dysfunction  Anxiety    Have reached out to AS/JA regarding the capabilities of apple watch 5 to record a true heart rhythm.  We need to make a distinction between SVT and VT  Suggested that in the event of recurrence you can take diltiazem 30 mg q. 1 hour x 3 and let his symptoms dictate the need to go to the hospital  Reviewed the ASSERT data related to SCAF and duration of atrial fibrillation risk of thromboembolism   COVID 19 screen The patient denies symptoms of COVID 19 at this time.  The importance of social distancing was discussed today.  Follow-up:       Current medicines are reviewed at length with the patient today.   The patient does not have concerns regarding his medicines.  The following changes were made today:  none  Labs/ tests ordered today include:   No orders of the defined types were placed in this encounter.   Future tests ( post COVID )  Plan  Patient Risk:  after full review of this patients clinical status, I feel that they are at moderate  risk at this time.  Today, I have spent 19* minutes with the patient with telehealth technology discussing the above.  Signed, Virl Axe, MD  10/09/2019 4:26 PM     [facility] [address] [place] [address] [phone] (office) [phone] (fax)

## 2019-10-16 ENCOUNTER — Telehealth: Payer: Medicare Other | Admitting: Internal Medicine

## 2020-09-25 DIAGNOSIS — Z03818 Encounter for observation for suspected exposure to other biological agents ruled out: Secondary | ICD-10-CM | POA: Diagnosis not present

## 2020-09-27 DIAGNOSIS — Z03818 Encounter for observation for suspected exposure to other biological agents ruled out: Secondary | ICD-10-CM | POA: Diagnosis not present

## 2020-09-27 DIAGNOSIS — Z20822 Contact with and (suspected) exposure to covid-19: Secondary | ICD-10-CM | POA: Diagnosis not present

## 2020-12-21 DIAGNOSIS — I700 Atherosclerosis of aorta: Secondary | ICD-10-CM | POA: Diagnosis not present

## 2020-12-21 DIAGNOSIS — I480 Paroxysmal atrial fibrillation: Secondary | ICD-10-CM | POA: Diagnosis not present

## 2020-12-21 DIAGNOSIS — F419 Anxiety disorder, unspecified: Secondary | ICD-10-CM | POA: Diagnosis not present

## 2021-01-12 DIAGNOSIS — M25561 Pain in right knee: Secondary | ICD-10-CM | POA: Diagnosis not present

## 2021-02-03 DIAGNOSIS — M25561 Pain in right knee: Secondary | ICD-10-CM | POA: Diagnosis not present

## 2021-02-21 DIAGNOSIS — H2513 Age-related nuclear cataract, bilateral: Secondary | ICD-10-CM | POA: Diagnosis not present

## 2021-02-21 DIAGNOSIS — H18513 Endothelial corneal dystrophy, bilateral: Secondary | ICD-10-CM | POA: Diagnosis not present

## 2021-02-21 DIAGNOSIS — H43312 Vitreous membranes and strands, left eye: Secondary | ICD-10-CM | POA: Diagnosis not present

## 2021-02-23 DIAGNOSIS — L578 Other skin changes due to chronic exposure to nonionizing radiation: Secondary | ICD-10-CM | POA: Diagnosis not present

## 2021-02-23 DIAGNOSIS — L821 Other seborrheic keratosis: Secondary | ICD-10-CM | POA: Diagnosis not present

## 2021-02-23 DIAGNOSIS — D1801 Hemangioma of skin and subcutaneous tissue: Secondary | ICD-10-CM | POA: Diagnosis not present

## 2021-02-23 DIAGNOSIS — L812 Freckles: Secondary | ICD-10-CM | POA: Diagnosis not present

## 2021-02-26 DIAGNOSIS — M25561 Pain in right knee: Secondary | ICD-10-CM | POA: Diagnosis not present

## 2021-03-08 DIAGNOSIS — M25561 Pain in right knee: Secondary | ICD-10-CM | POA: Diagnosis not present

## 2021-03-08 DIAGNOSIS — S82124D Nondisplaced fracture of lateral condyle of right tibia, subsequent encounter for closed fracture with routine healing: Secondary | ICD-10-CM | POA: Diagnosis not present

## 2021-04-20 DIAGNOSIS — H5203 Hypermetropia, bilateral: Secondary | ICD-10-CM | POA: Diagnosis not present

## 2021-06-13 DIAGNOSIS — N528 Other male erectile dysfunction: Secondary | ICD-10-CM | POA: Diagnosis not present

## 2021-06-13 DIAGNOSIS — N400 Enlarged prostate without lower urinary tract symptoms: Secondary | ICD-10-CM | POA: Diagnosis not present

## 2021-06-13 DIAGNOSIS — N50811 Right testicular pain: Secondary | ICD-10-CM | POA: Diagnosis not present

## 2021-06-20 DIAGNOSIS — Z23 Encounter for immunization: Secondary | ICD-10-CM | POA: Diagnosis not present

## 2021-06-23 DIAGNOSIS — E782 Mixed hyperlipidemia: Secondary | ICD-10-CM | POA: Diagnosis not present

## 2021-06-23 DIAGNOSIS — F419 Anxiety disorder, unspecified: Secondary | ICD-10-CM | POA: Diagnosis not present

## 2021-06-23 DIAGNOSIS — E039 Hypothyroidism, unspecified: Secondary | ICD-10-CM | POA: Diagnosis not present

## 2021-06-23 DIAGNOSIS — Z125 Encounter for screening for malignant neoplasm of prostate: Secondary | ICD-10-CM | POA: Diagnosis not present

## 2021-06-23 DIAGNOSIS — Z0000 Encounter for general adult medical examination without abnormal findings: Secondary | ICD-10-CM | POA: Diagnosis not present

## 2021-07-08 DIAGNOSIS — H6123 Impacted cerumen, bilateral: Secondary | ICD-10-CM | POA: Diagnosis not present

## 2021-07-11 IMAGING — US US SCROTUM W/ DOPPLER COMPLETE
1 series · 14 of 25 positions shown · non-contrast
Comparison: None.

CLINICAL DATA: Right testicle pain.

EXAM:
SCROTAL ULTRASOUND
DOPPLER ULTRASOUND OF THE TESTICLES
TECHNIQUE: Complete ultrasound examination of the testicles, epididymis, and
other scrotal structures was performed. Color and spectral Doppler
ultrasound were also utilized to evaluate blood flow to the
testicles.

[Series 1: us scrotum w/ doppler complete · 0.05mm/px · 14 of 68 slices shown]
[im 1/68]
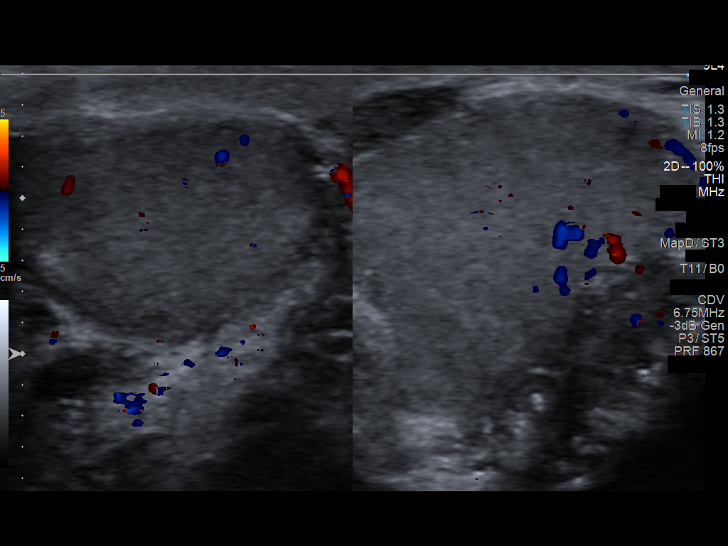
[im 6/68]
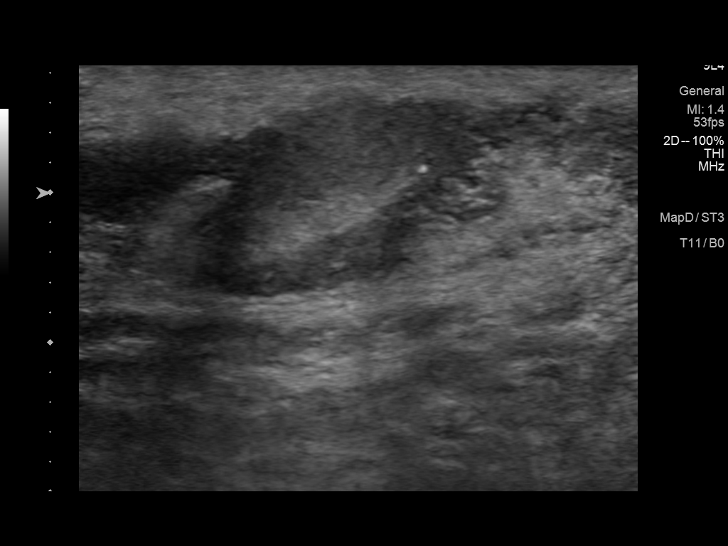
[im 12/68]
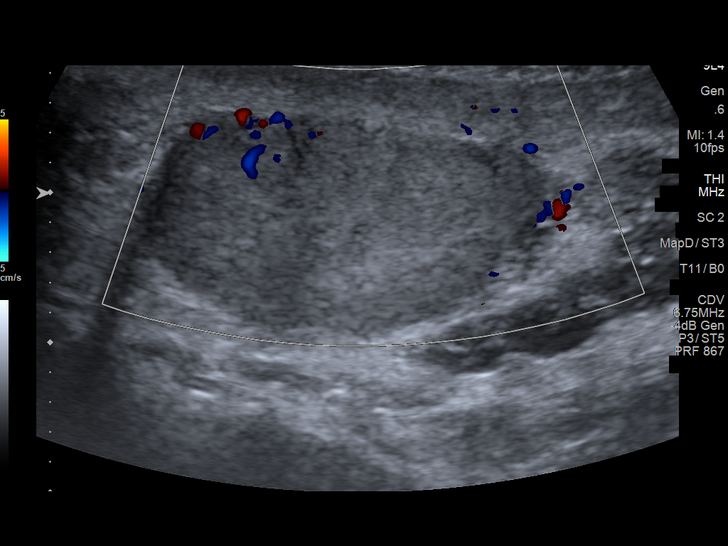
[im 17/68]
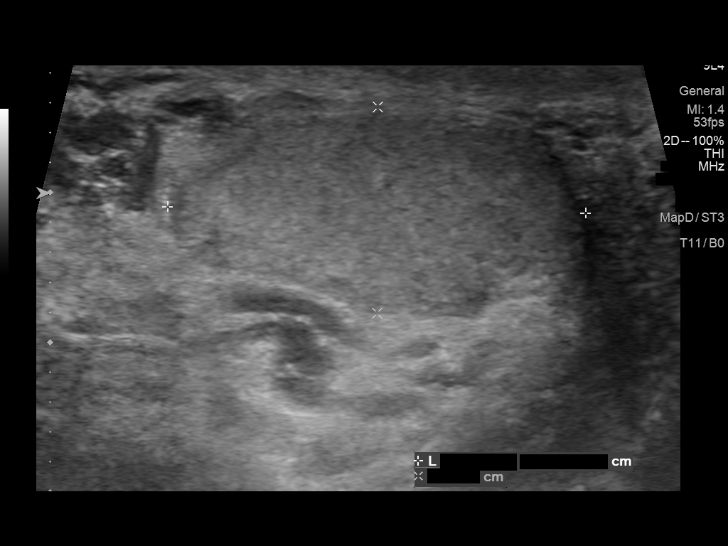
[im 23/68]
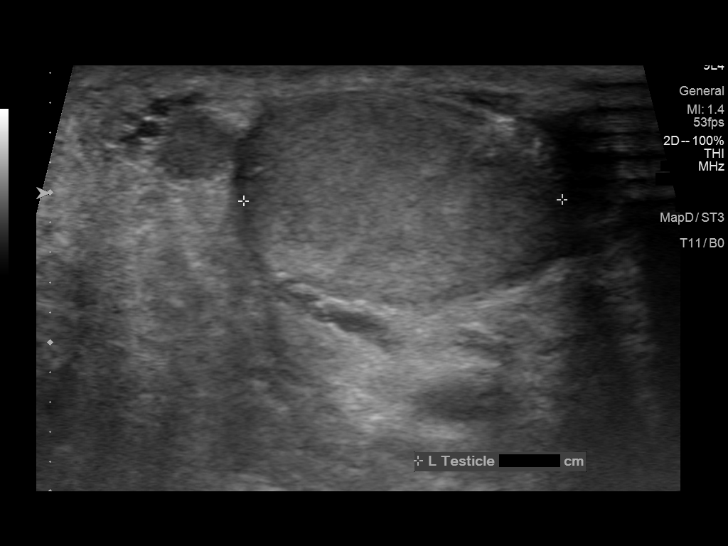
[im 26/68]
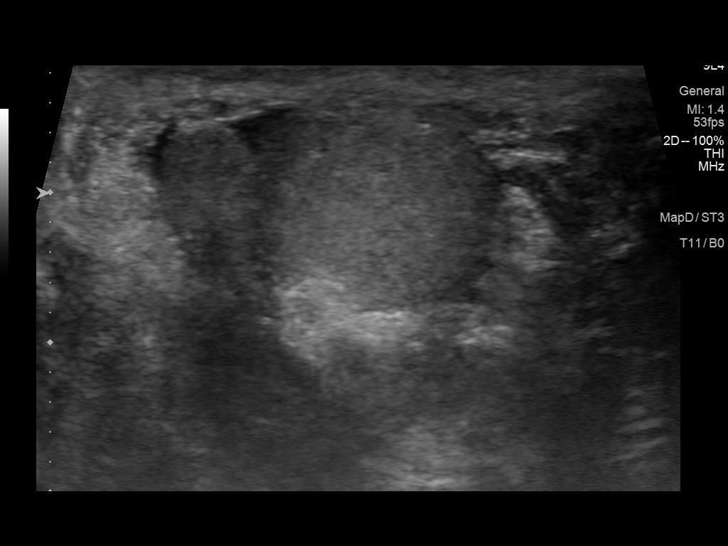
[im 31/68]
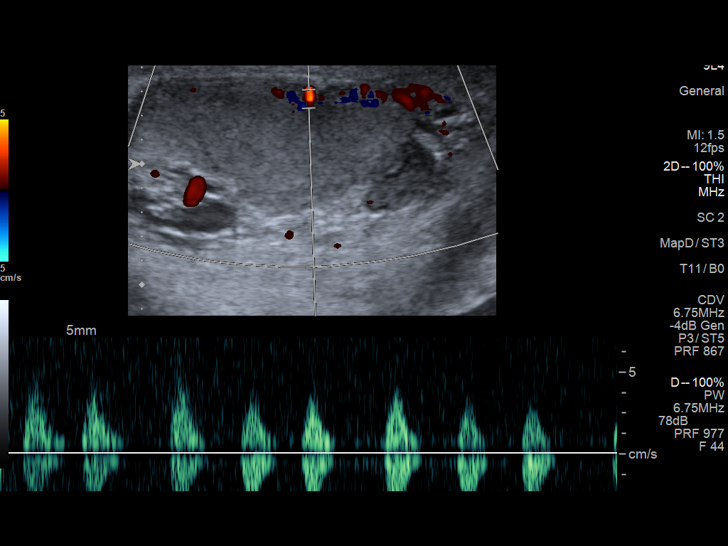
[im 37/68]
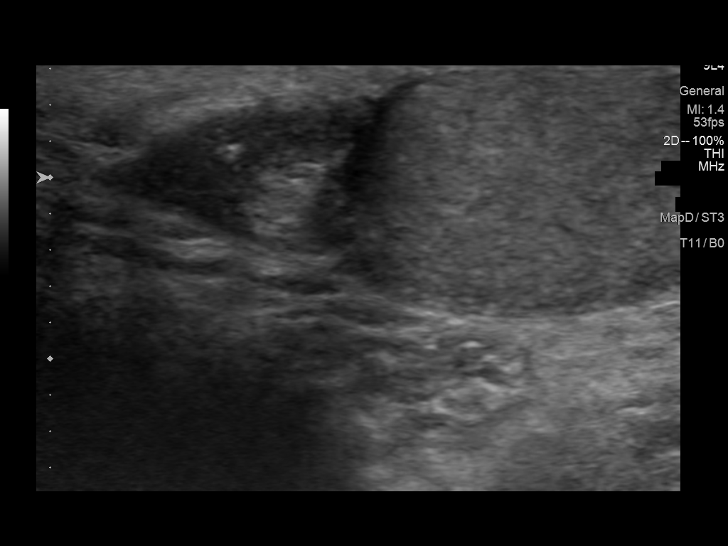
[im 42/68]
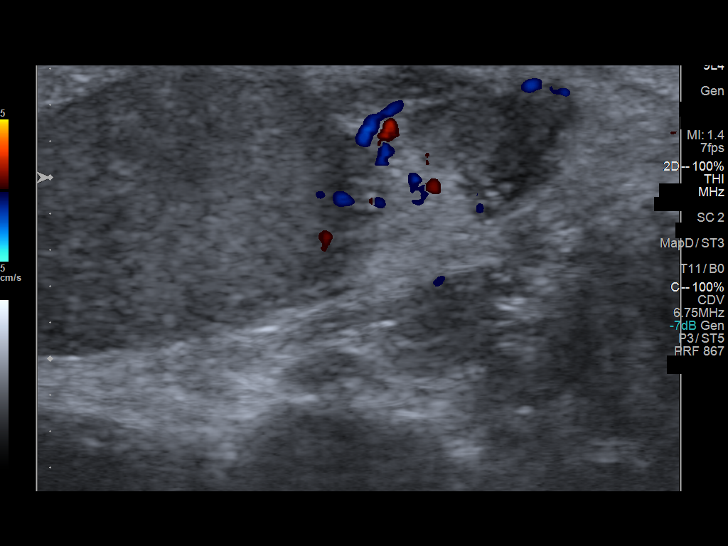
[im 45/68]
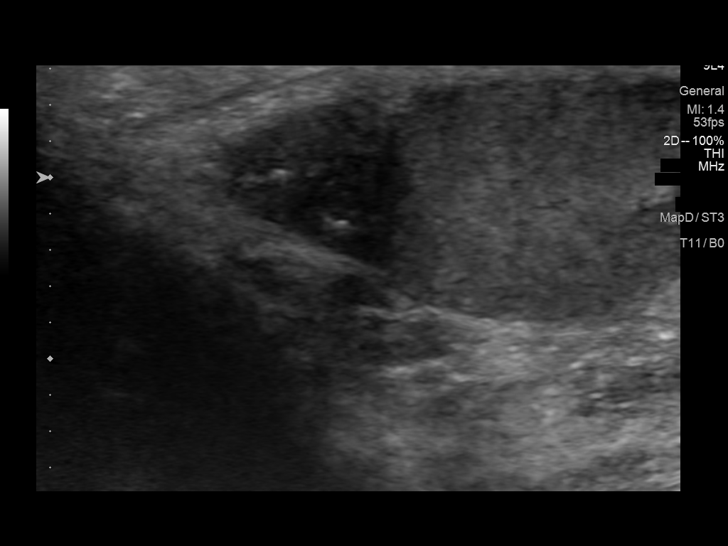
[im 51/68]
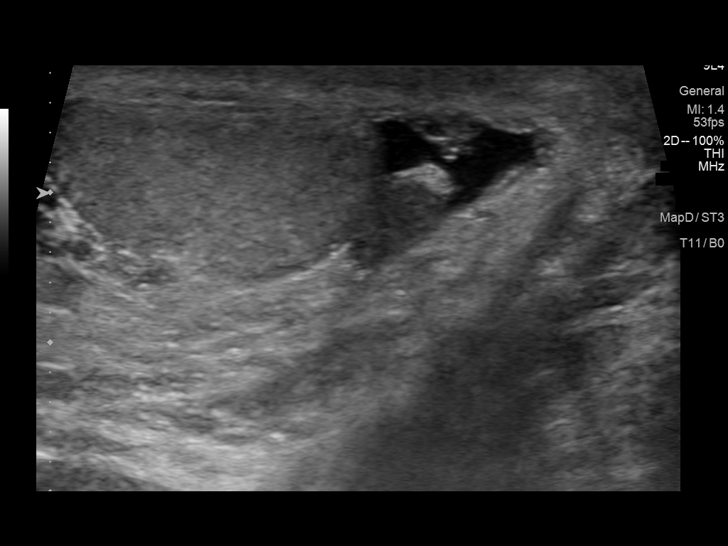
[im 56/68]
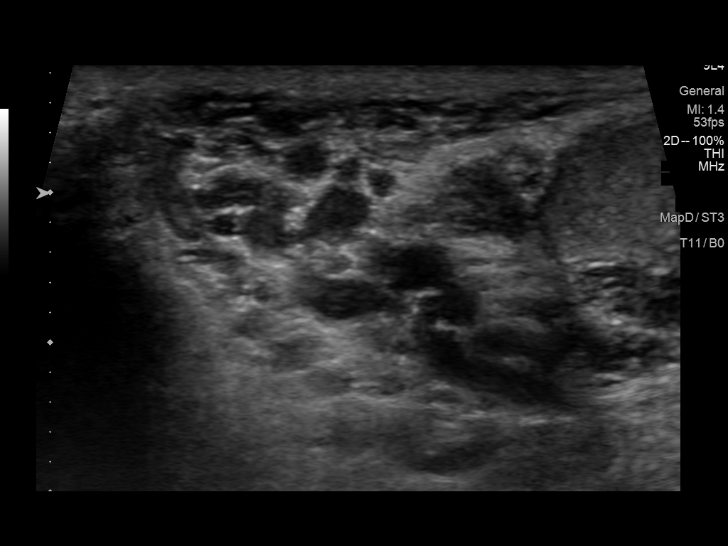
[im 62/68]
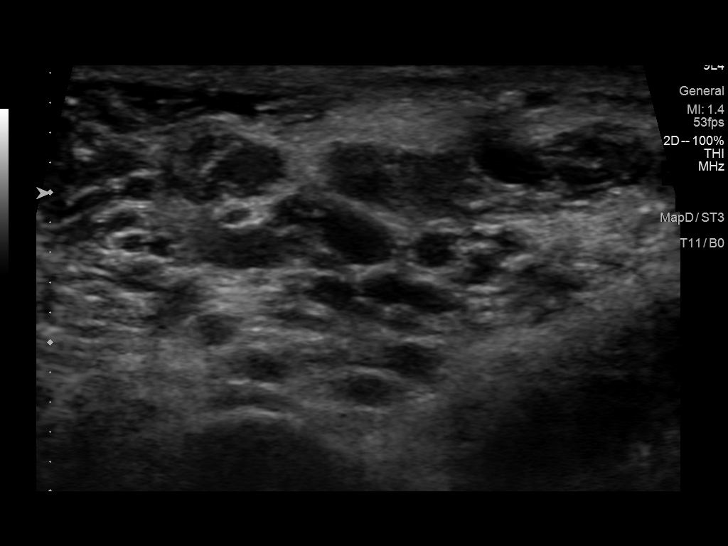
[im 68/68]
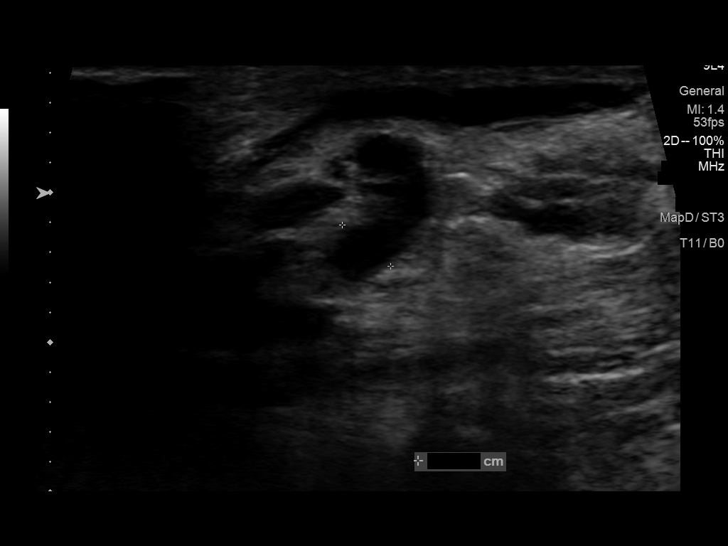

[14 of 25 positions shown; findings below may reference images not displayed]

FINDINGS: Right testicle

Measurements: 2.5 x 1.6 x 1.5 cm. No mass or microlithiasis
visualized.

Left testicle

Measurements: 2.8 x 1.4 x 2.1 cm. No mass or microlithiasis
visualized.

Right epididymis: 6 mm cyst in the right epididymis. Otherwise
normal.

Left epididymis:  Normal in size and appearance.

Hydrocele:  None visualized.

Varicocele:  Left varicocele.

Pulsed Doppler interrogation of both testes demonstrates normal low
resistance arterial and venous waveforms bilaterally.
IMPRESSION: No acute abnormalities. Left varicocele. Normal appearing testicles.

## 2021-10-11 DIAGNOSIS — M25551 Pain in right hip: Secondary | ICD-10-CM | POA: Diagnosis not present

## 2021-10-11 DIAGNOSIS — M545 Low back pain, unspecified: Secondary | ICD-10-CM | POA: Diagnosis not present

## 2021-10-12 DIAGNOSIS — R1030 Lower abdominal pain, unspecified: Secondary | ICD-10-CM | POA: Diagnosis not present

## 2021-10-17 DIAGNOSIS — M25551 Pain in right hip: Secondary | ICD-10-CM | POA: Diagnosis not present

## 2021-10-17 DIAGNOSIS — M545 Low back pain, unspecified: Secondary | ICD-10-CM | POA: Diagnosis not present

## 2021-10-31 DIAGNOSIS — M545 Low back pain, unspecified: Secondary | ICD-10-CM | POA: Diagnosis not present

## 2022-03-20 DIAGNOSIS — L218 Other seborrheic dermatitis: Secondary | ICD-10-CM | POA: Diagnosis not present

## 2022-03-20 DIAGNOSIS — L812 Freckles: Secondary | ICD-10-CM | POA: Diagnosis not present

## 2022-03-20 DIAGNOSIS — L821 Other seborrheic keratosis: Secondary | ICD-10-CM | POA: Diagnosis not present

## 2022-03-20 DIAGNOSIS — K130 Diseases of lips: Secondary | ICD-10-CM | POA: Diagnosis not present

## 2022-03-27 ENCOUNTER — Encounter: Payer: Self-pay | Admitting: Internal Medicine

## 2022-03-27 ENCOUNTER — Ambulatory Visit (INDEPENDENT_AMBULATORY_CARE_PROVIDER_SITE_OTHER): Payer: Medicare Other | Admitting: Internal Medicine

## 2022-03-27 VITALS — BP 114/72 | HR 69 | Ht 68.5 in | Wt 176.2 lb

## 2022-03-27 DIAGNOSIS — I480 Paroxysmal atrial fibrillation: Secondary | ICD-10-CM

## 2022-03-27 DIAGNOSIS — I493 Ventricular premature depolarization: Secondary | ICD-10-CM

## 2022-03-27 DIAGNOSIS — I491 Atrial premature depolarization: Secondary | ICD-10-CM

## 2022-03-27 NOTE — Progress Notes (Signed)
      Patient Care Team: Orpah Melter, MD as PCP - General (Family Medicine) Deboraha Sprang, MD as Consulting Physician (Cardiology)   HPI  Marc Benson is a 71 y.o. male Seen in followup for paroxysmal atrial fibrillation. He has undergone pulmonary vein isolation X 2   Saw [facility]-PA 10/19 for palpitations, started but intolerant of metoprolol  Continues with intermittent palpitations typically lasting seconds never more than minutes, not being identified as atrial fibrillation by his Apple watch  Continues with some anxiety although better.  He was able to wean himself off of his Xanax about 16 months ago.  Feels much better.   The patient denies chest pain, shortness of breath, nocturnal dyspnea, orthopnea or peripheral edema.  There have been no lightheadedness or syncope.      DATE TEST EF   11/19 Echo   50-55 % Ao V thickened and Calcified  (NO Ao V grad reported   1/20 CTA  CaScore 313 No obstructive disease      Thromboembolic risk factors ( age -18) for a CHADSVASc Score of >=1     Past Medical History:  Diagnosis Date   ANXIETY 05/14/2009   PAROXYSMAL ATRIAL FIBRILLATION 05/14/2009   s/p PVI x 2 [facility]   PREMATURE VENTRICULAR CONTRACTIONS 05/14/2009   SLEEP DISORDER, HX OF 05/14/2009    Past Surgical History:  Procedure Laterality Date   ABLATION     CARDIAC CATHETERIZATION  11/18/2004      Current Outpatient Medications  Medication Sig Dispense Refill   atorvastatin (LIPITOR) 40 MG tablet Take 1 tablet (40 mg total) by mouth daily. 90 tablet 3   diltiazem (CARDIZEM) 30 MG tablet Take 1 tablet (30 mg total) by mouth as needed for up to 1 dose (persistent palpitations). 10 tablet 0   levothyroxine (SYNTHROID, LEVOTHROID) 50 MCG tablet Take 50 mcg by mouth daily.      Magnesium 400 MG TABS Take 1 tablet by mouth daily.     nortriptyline (PAMELOR) 10 MG capsule Take 10 mg by mouth at bedtime.      tamsulosin (FLOMAX) 0.4 MG CAPS capsule Take 0.4 mg by mouth  daily.     No current facility-administered medications for this visit.    Allergies  Allergen Reactions   Latex Itching and Rash    "LATEX TAPE"   Benadryl [Diphenhydramine Hcl]     Review of Systems negative except from HPI and PMH  Physical Exam Blood pressure 114/72, pulse 69, height 5' 8.5" (1.74 m), weight 176 lb 3.2 oz (79.9 kg), SpO2 96 %.  Well developed and nourished in no acute distress HENT normal Neck supple with JVP-  flat  Clear Regular rate and rhythm, no murmurs or gallops Abd-soft with active BS No Clubbing cyanosis edema Skin-warm and dry A & Oriented  Grossly normal sensory and motor function  ECG sinus at 69 Intervals 15/10/42   Assessment and  Plan  Atrial fibrillation s/p PVI x2  PACs -frequent  Anxiety  Cor Artery Calcification ( Score 313)     Continues with occasional PACs and tachy palpitations not identified as atrial fibrillation by his Apple Watch  Tolerating atorvastatin for his elevated calcium score.  Discussed sleep and the use of melatonin and other sleep aids.  Discussed the benefits of reprogramming his computer screens

## 2022-03-27 NOTE — Patient Instructions (Signed)

## 2022-05-16 DIAGNOSIS — H524 Presbyopia: Secondary | ICD-10-CM | POA: Diagnosis not present

## 2022-06-19 DIAGNOSIS — R3912 Poor urinary stream: Secondary | ICD-10-CM | POA: Diagnosis not present

## 2022-06-19 DIAGNOSIS — N401 Enlarged prostate with lower urinary tract symptoms: Secondary | ICD-10-CM | POA: Diagnosis not present

## 2022-06-19 DIAGNOSIS — N50811 Right testicular pain: Secondary | ICD-10-CM | POA: Diagnosis not present

## 2022-06-28 DIAGNOSIS — Z125 Encounter for screening for malignant neoplasm of prostate: Secondary | ICD-10-CM | POA: Diagnosis not present

## 2022-06-28 DIAGNOSIS — E782 Mixed hyperlipidemia: Secondary | ICD-10-CM | POA: Diagnosis not present

## 2022-06-28 DIAGNOSIS — Z0000 Encounter for general adult medical examination without abnormal findings: Secondary | ICD-10-CM | POA: Diagnosis not present

## 2022-06-28 DIAGNOSIS — F419 Anxiety disorder, unspecified: Secondary | ICD-10-CM | POA: Diagnosis not present

## 2022-06-28 DIAGNOSIS — E039 Hypothyroidism, unspecified: Secondary | ICD-10-CM | POA: Diagnosis not present

## 2022-10-17 DIAGNOSIS — K080 Exfoliation of teeth due to systemic causes: Secondary | ICD-10-CM | POA: Diagnosis not present

## 2023-02-07 DIAGNOSIS — J069 Acute upper respiratory infection, unspecified: Secondary | ICD-10-CM | POA: Diagnosis not present

## 2023-04-02 DIAGNOSIS — K4091 Unilateral inguinal hernia, without obstruction or gangrene, recurrent: Secondary | ICD-10-CM | POA: Diagnosis not present

## 2023-04-13 NOTE — Progress Notes (Signed)
Electrophysiology Office Note:   Date:  04/16/2023  ID:  Cammeron, Greis [birth year], MRN [id number]  Primary Cardiologist: None Electrophysiologist: Sherryl Manges, MD      History of Present Illness:   Marc Benson is a 72 y.o. male with h/o paroxysmal AF s/p PVI x2 seen today for routine electrophysiology followup.   Last seen in EP Clinic 03/2022 per Dr. Graciela Husbands.  At that time, the patient had intermittent palpitations lasting seconds, never more than minutes and not identified as AF by his Apple watch. Reported anxiety but improved, weaned himself off xanax. No changes made to regimen.   Since last being seen in our clinic the patient reports doing fairly well. He notes he has not had very many episodes of fast rates.  He feels overall that the events have slowed down - has not had one in several months.  When episodes occur his rate is 175-215 bpm and lasts 2-10 minutes. When they occur, his energy level is "cut in half".  He has been off anticoagulation for many years. He has lost weight by watching what he eats.  He continues to golf 3-4 times per week. Remains active in his church and helps his neighbors out by mowing their yard.   His largest concern is that he is not sleeping well. He feels it is that he is on his devices too much.  Has difficulty falling asleep but not staying asleep.   He denies chest pain, palpitations, dyspnea, PND, orthopnea, nausea, vomiting, dizziness, syncope, edema, weight gain, or early satiety.   Review of systems complete and found to be negative unless listed in HPI.   EP Information / Studies Reviewed:    EKG is ordered today. Personal review as below.  EKG Interpretation Date/Time:  Monday April 16 2023 09:11:07 EDT Ventricular Rate:  66 PR Interval:  156 QRS Duration:  100 QT Interval:  438 QTC Calculation: 459 R Axis:   83  Text Interpretation: Normal sinus rhythm Low voltage QRS Confirmed by Canary Brim ([id number]) on 04/16/2023 9:21:34 AM    Arrhythmia  PAF s/p PVI x2 - March & Nov 2007. Has been off coumadin at least since 2015 2019 - intolerant to metoprolol for palpitations PAC's 10/19 event recorder for palpitations demonstrated recurrent tachycardia notably both SVT as well as ventricular tachycardia-nonsustained-14 beats   Studies: ECHO 2019 > LEF 5-55%, no RWMA, AV severely thickened, severely calcified, trivial TVR, trivial PVR CT Coronary 09/2018 > CCS 313 / 68th percentile for age, normal coronary origin with right dominance, mild non-obs disease in all three coronaries and the left main  Risk Assessment/Calculations:    CHA2DS2-VASc Score = 1   This indicates a 0.6% annual risk of stroke. The patient's score is based upon: CHF History: 0 HTN History: 0 Diabetes History: 0 Stroke History: 0 Vascular Disease History: 0 Age Score: 1 Gender Score: 0              Physical Exam:   VS:  BP 132/80   Pulse 66   Ht 5\' 8"  (1.727 m)   Wt 176 lb (79.8 kg)   BMI 26.76 kg/m    Wt Readings from Last 3 Encounters:  04/16/23 176 lb (79.8 kg)  03/27/22 176 lb 3.2 oz (79.9 kg)  10/09/19 187 lb (84.8 kg)     GEN: Well nourished, well developed in no acute distress NECK: No JVD; No carotid bruits CARDIAC: Regular rate and rhythm, no murmurs, rubs, gallops RESPIRATORY:  Clear  to auscultation without rales, wheezing or rhonchi  ABDOMEN: Soft, non-tender, non-distended EXTREMITIES:  No edema; No deformity   ASSESSMENT AND PLAN:    Paroxysmal Atrial Fibrillation s/p PVI x2 ([facility]) CHA2DS2Vasc 1  -continue cardizem 30mg  PRN for palpitations > has not used -no frequent episodes of AF  -educated patient if he develops frequent episodes, or change in his status such as HTN, would advocate to resume anticoagulation   Non-Obstructive CAD  Coronary Calcium Score 313  -atorvastatin per Cardiology  Follow up with Dr. Graciela Husbands in 12 months  Signed, Canary Brim, MSN, APRN, NP-C, AGACNP-BC [facility] -  Electrophysiology  04/16/2023, 10:23 AM

## 2023-04-16 ENCOUNTER — Encounter: Payer: Self-pay | Admitting: Pulmonary Disease

## 2023-04-16 ENCOUNTER — Ambulatory Visit: Payer: Medicare Other | Attending: Student | Admitting: Pulmonary Disease

## 2023-04-16 VITALS — BP 132/80 | HR 66 | Ht 68.0 in | Wt 176.0 lb

## 2023-04-16 DIAGNOSIS — I493 Ventricular premature depolarization: Secondary | ICD-10-CM

## 2023-04-16 DIAGNOSIS — I480 Paroxysmal atrial fibrillation: Secondary | ICD-10-CM

## 2023-04-16 DIAGNOSIS — Z85828 Personal history of other malignant neoplasm of skin: Secondary | ICD-10-CM | POA: Diagnosis not present

## 2023-04-16 DIAGNOSIS — L821 Other seborrheic keratosis: Secondary | ICD-10-CM | POA: Diagnosis not present

## 2023-04-16 DIAGNOSIS — D1801 Hemangioma of skin and subcutaneous tissue: Secondary | ICD-10-CM | POA: Diagnosis not present

## 2023-04-16 DIAGNOSIS — I491 Atrial premature depolarization: Secondary | ICD-10-CM | POA: Diagnosis not present

## 2023-04-16 DIAGNOSIS — L812 Freckles: Secondary | ICD-10-CM | POA: Diagnosis not present

## 2023-04-16 NOTE — Patient Instructions (Signed)
Medication Instructions:  Your physician recommends that you continue on your current medications as directed. Please refer to the Current Medication list given to you today.  *If you need a refill on your cardiac medications before your next appointment, please call your pharmacy*  Lab Work: None ordered today.  Testing/Procedures: None ordered today.  Follow-Up: At [facility], you and your health needs are our priority.  As part of our continuing mission to provide you with exceptional heart care, we have created designated Provider Care Teams.  These Care Teams include your primary Cardiologist (physician) and Advanced Practice Providers (APPs -  Physician Assistants and Nurse Practitioners) who all work together to provide you with the care you need, when you need it.  Your next appointment:   1 year(s)  The format for your next appointment:   In Person  Provider:   You may see Sherryl Manges, MD or one of the following Advanced Practice Providers on your designated Care Team:   Francis Dowse, [place] [address] [place], [place] Sherie Don, NP Canary Brim, [facility]

## 2023-04-19 ENCOUNTER — Ambulatory Visit: Payer: Self-pay | Admitting: Surgery

## 2023-04-20 ENCOUNTER — Other Ambulatory Visit: Payer: Self-pay | Admitting: Surgery

## 2023-04-20 DIAGNOSIS — R1031 Right lower quadrant pain: Secondary | ICD-10-CM | POA: Diagnosis not present

## 2023-04-30 ENCOUNTER — Ambulatory Visit
Admission: RE | Admit: 2023-04-30 | Discharge: 2023-04-30 | Disposition: A | Discharge status: Home / Self Care | Payer: Medicare Other | Source: Ambulatory Visit | Attending: Surgery | Admitting: Surgery

## 2023-04-30 DIAGNOSIS — R1031 Right lower quadrant pain: Secondary | ICD-10-CM

## 2023-04-30 DIAGNOSIS — N400 Enlarged prostate without lower urinary tract symptoms: Secondary | ICD-10-CM | POA: Diagnosis not present

## 2023-04-30 DIAGNOSIS — K5730 Diverticulosis of large intestine without perforation or abscess without bleeding: Secondary | ICD-10-CM | POA: Diagnosis not present

## 2023-04-30 MED ORDER — GADOPICLENOL 0.5 MMOL/ML IV SOLN
7.5000 mL | Freq: Once | INTRAVENOUS | Status: AC | PRN
  Administered 2023-04-30: 7.5 mL via INTRAVENOUS

## 2023-05-02 DIAGNOSIS — K080 Exfoliation of teeth due to systemic causes: Secondary | ICD-10-CM | POA: Diagnosis not present

## 2023-05-08 NOTE — Progress Notes (Signed)
Please call the patient and let them know that their MRI was normal.  There is no sign of recurrent inguinal hernia.  This is likely a muscle strain on the area of the previous hernia repair.  Rest and NSAID's.  Follow-up PRN

## 2023-05-25 DIAGNOSIS — H524 Presbyopia: Secondary | ICD-10-CM | POA: Diagnosis not present

## 2023-07-05 DIAGNOSIS — Z0000 Encounter for general adult medical examination without abnormal findings: Secondary | ICD-10-CM | POA: Diagnosis not present

## 2023-07-05 DIAGNOSIS — R7301 Impaired fasting glucose: Secondary | ICD-10-CM | POA: Diagnosis not present

## 2023-07-05 DIAGNOSIS — Z23 Encounter for immunization: Secondary | ICD-10-CM | POA: Diagnosis not present

## 2023-07-05 DIAGNOSIS — Z125 Encounter for screening for malignant neoplasm of prostate: Secondary | ICD-10-CM | POA: Diagnosis not present

## 2023-07-05 DIAGNOSIS — I700 Atherosclerosis of aorta: Secondary | ICD-10-CM | POA: Diagnosis not present

## 2023-07-05 DIAGNOSIS — I480 Paroxysmal atrial fibrillation: Secondary | ICD-10-CM | POA: Diagnosis not present

## 2023-07-05 DIAGNOSIS — E039 Hypothyroidism, unspecified: Secondary | ICD-10-CM | POA: Diagnosis not present

## 2023-07-05 DIAGNOSIS — E782 Mixed hyperlipidemia: Secondary | ICD-10-CM | POA: Diagnosis not present

## 2023-07-05 LAB — LIPID PANEL
Cholesterol: 165 (ref 0–200)
HDL: 79 — AB (ref 35–70)
LDL Cholesterol: 68
LDl/HDL Ratio: 2.1
Triglycerides: 98 (ref 40–160)

## 2023-07-05 LAB — BASIC METABOLIC PANEL WITH GFR
BUN: 17 (ref 4–21)
CO2: 29 — AB (ref 13–22)
Chloride: 105 (ref 99–108)
Creatinine: 0.9 (ref 0.6–1.3)
Glucose: 95
Potassium: 4.1 meq/L (ref 3.5–5.1)
Sodium: 139 (ref 137–147)

## 2023-07-05 LAB — CBC AND DIFFERENTIAL
HCT: 37 — AB (ref 41–53)
Hemoglobin: 12.9 — AB (ref 13.5–17.5)
Neutrophils Absolute: 2.2
Platelets: 161 K/uL (ref 150–400)
WBC: 4.4

## 2023-07-05 LAB — HEPATIC FUNCTION PANEL
ALT: 16 U/L (ref 10–40)
AST: 22 (ref 14–40)
Alkaline Phosphatase: 45 (ref 25–125)
Bilirubin, Total: 1.1

## 2023-07-05 LAB — COMPREHENSIVE METABOLIC PANEL WITH GFR
Albumin: 4.2 (ref 3.5–5.0)
Calcium: 9.2 (ref 8.7–10.7)
eGFR: 85

## 2023-07-05 LAB — HEMOGLOBIN A1C
Hemoglobin A1C: 5.4
Hemoglobin A1C: 5.4

## 2023-07-05 LAB — CBC: RBC: 3.89 (ref 3.87–5.11)

## 2023-07-05 LAB — TSH
TSH: 2.09 (ref 0.41–5.90)
TSH: 2.09 (ref 0.41–5.90)

## 2023-09-04 DIAGNOSIS — F419 Anxiety disorder, unspecified: Secondary | ICD-10-CM | POA: Diagnosis not present

## 2023-09-13 ENCOUNTER — Other Ambulatory Visit: Payer: Self-pay | Admitting: Medical Genetics

## 2023-09-17 DIAGNOSIS — R413 Other amnesia: Secondary | ICD-10-CM | POA: Diagnosis not present

## 2023-09-17 DIAGNOSIS — G3184 Mild cognitive impairment, so stated: Secondary | ICD-10-CM | POA: Diagnosis not present

## 2023-09-17 DIAGNOSIS — F419 Anxiety disorder, unspecified: Secondary | ICD-10-CM | POA: Diagnosis not present

## 2023-09-17 LAB — VITAMIN B12: Vitamin B-12: 544

## 2023-09-18 ENCOUNTER — Other Ambulatory Visit: Payer: Self-pay | Admitting: Family Medicine

## 2023-09-18 DIAGNOSIS — R413 Other amnesia: Secondary | ICD-10-CM

## 2023-09-19 ENCOUNTER — Other Ambulatory Visit (HOSPITAL_COMMUNITY)
Admission: RE | Admit: 2023-09-19 | Discharge: 2023-09-19 | Disposition: A | Discharge status: Home / Self Care | Payer: Self-pay | Source: Ambulatory Visit | Attending: Oncology | Admitting: Oncology

## 2023-09-23 ENCOUNTER — Ambulatory Visit: Payer: Medicare Other

## 2023-09-23 DIAGNOSIS — R413 Other amnesia: Secondary | ICD-10-CM | POA: Diagnosis not present

## 2023-09-23 DIAGNOSIS — J341 Cyst and mucocele of nose and nasal sinus: Secondary | ICD-10-CM | POA: Diagnosis not present

## 2023-09-23 DIAGNOSIS — I6782 Cerebral ischemia: Secondary | ICD-10-CM | POA: Diagnosis not present

## 2023-10-01 LAB — GENECONNECT MOLECULAR SCREEN: Genetic Analysis Overall Interpretation: NEGATIVE

## 2023-11-05 DIAGNOSIS — K080 Exfoliation of teeth due to systemic causes: Secondary | ICD-10-CM | POA: Diagnosis not present

## 2023-12-26 ENCOUNTER — Encounter: Payer: Self-pay | Admitting: Neurology

## 2023-12-27 ENCOUNTER — Ambulatory Visit: Payer: Medicare Other | Admitting: Neurology

## 2023-12-27 ENCOUNTER — Encounter: Payer: Self-pay | Admitting: Neurology

## 2023-12-27 VITALS — BP 124/69 | HR 71 | Ht 68.0 in | Wt 178.0 lb

## 2023-12-27 DIAGNOSIS — G3184 Mild cognitive impairment, so stated: Secondary | ICD-10-CM

## 2023-12-27 DIAGNOSIS — F411 Generalized anxiety disorder: Secondary | ICD-10-CM

## 2023-12-27 MED ORDER — DONEPEZIL HCL 5 MG PO TABS: 5.0000 mg | ORAL_TABLET | Freq: Every day | ORAL | 0 refills | Status: DC

## 2023-12-27 NOTE — Progress Notes (Signed)
 [facility]  Provider:  Dr Oluwakemi Salsberry Referring Provider: Koren Shiver, DO Primary Care Physician:  Joycelyn Rua, MD  Chief Complaint  Patient presents with   MCI    Rm 1 with spouse Pt is well, reports he has trouble with finishing his sentences, comprehension and finding words. This has been a concern for less than 2 yrs. Does ADL alone.     HPI:  Marc Benson is a 74 y.o. male and seen here upon referral from Dr. Redgie Grayer for a Consultation/ Evaluation of cognitive concerns. .  This patient reports onset of memory problems  over a period of 18 months   "I retired on disability for atrial fib and anxiety on 2003 , was on coumadin for 6 years until 6 years ago. No TBI."  Marc Benson reports that he has intermittent tachycardia and with heart rates up to 180 or 200 bpm he has a medication in form of Cardizem a constant calcium channel blocker available that he takes as needed as needed and he can take it up every hour and if the third dose is not correcting the ventricular tachycardia he is supposed to go to the emergency room he has rarely needed to take the Cardizem however I was surprised to see that it is not a routine regular daily drug for him in order to prevent some of these spells he is also on Lipitor, Synthroid, magnesium 400 mg daily Pamelor and Flomax.  The MRI that was ordered through his primary care physician is already quoted below it showed only a lacunar and is otherwise normal.  There were some microvascular bleeds these hemochromatosis defects can be seen in patients that were on long-term anticoagulation as this patient has been.  They are not necessarily reflecting an amyloid angiopathy.  As of Dr. Carollee Herter Mesner's referral she stated that this is a 73 year old Caucasian male with more frequent and more progressive memory changes mostly in word finding word searching but also forgetting names of acquaintances and 18 months ago was about the first  time he noticed this.  His imaging was pending at the time she wrote this note.  She had referred apparently to neuropsychology and  to our neurology office . I appreciate her notes and the detail:  The patient does not report any visual or spatial changes but he did have some challenges with visual-spatial test questions on the [facility] cognitive assessment.  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  He had no previous known head injuries and no participation in contact sports, no prolonged surgeries with prolonged anesthesia.  He had anxiety since young childhood and he had been on various medications but well-controlled on nortriptyline for many years.  He states that he has started supplementation with vitamin B12 and vitamin D and over the last 6 months, he has kept his weight and has always been cautious about weight gain.  He is aware of healthy lifestyles and has implemented those.  His BMI is 27.  He had a normal regular pulse and his blood pressure today was 124/69 mmHg.  The patient had extensive laboratory testing vitamin B12 was 544 hemoglobin A1c was normal at 5.4, total thyroid-stimulating hormone was tested but not T4, his metabolic panel was normal except for slight elevation in bilirubin 1.1 this is an elevation of about 1.0 and I am not sure  how importance of this.  He had the MRI which showed an artifact shadow over the frontal lobe.   Fam Histoyr : first cousin with AD,  father : morbidly obese,  death by CAD MI at age 38, mother died of cerebral  aneurysm age 37 .  Paternal GF was morbidly obese.  Sister morbidly obese.    Labs reviewed:    IMPRESSION: of MRI at [facility] :   10/03/2023 :  1. Susceptibility artifact arising from the face region partially obscures the anteroinferior frontal lobes on the susceptibility-weighted sequence. Within this limitation,  findings are as follows. 2. No evidence of an acute intracranial abnormality. 3.  No age-advanced or lobar predominant parenchymal atrophy. 4. 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 5. Background chronic small vessel ischemic changes which are moderate in the cerebral white matter and mild in the pons. 6. 15 mm left maxillary sinus mucous retention cyst.   Review of Systems: Out of a complete 14 system review, the patient complains of only the following symptoms, and all other reviewed systems are negative.    Social History   Socioeconomic History   Marital status: Married    Spouse name: Not on file   Number of children: Not on file   Years of education: Not on file   Highest education level: Not on file  Occupational History   Not on file  Tobacco Use   Smoking status: Former    Current packs/day: 0.00    Types: Cigarettes    Quit date: 09/19/1971    Years since quitting: 52.3   Smokeless tobacco: Never  Vaping Use   Vaping status: Never Used  Substance and Sexual Activity   Alcohol use: No   Drug use: No   Sexual activity: Not on file  Other Topics Concern   Not on file  Social History Narrative   Not on file   Social Drivers of Health   Financial Resource Strain: Not on file  Food Insecurity: Not on file  Transportation Needs: Not on file  Physical Activity: Not on file  Stress: Not on file  Social Connections: Unknown (01/20/2022)   [facility] from [facility], [facility]   Social Network    Social Network: Not on file  Intimate Partner Violence: Unknown (12/12/2021)   [facility], [facility]   HITS    Physically Hurt: Not on file    Insult or Talk Down To: Not on file    Threaten Physical Harm: Not on file    Scream or Curse: Not on file    Family History  Problem Relation Age of Onset   Aneurysm Mother    Heart attack Father     Past Medical History:  Diagnosis Date   ANXIETY 05/14/2009   Hypothyroidism     Mixed hyperlipidemia    PAROXYSMAL ATRIAL FIBRILLATION 05/14/2009   s/p PVI x 2 [facility]   PREMATURE VENTRICULAR CONTRACTIONS 05/14/2009   SLEEP DISORDER, HX OF 05/14/2009    Past Surgical History:  Procedure Laterality Date   ABLATION     CARDIAC CATHETERIZATION  11/18/2004   head surgery  Left 2015   BCC removed from Lt side of head- Dermatologist Dr Donzetta Starch   INGUINAL HERNIA REPAIR Right 2002   ROTATOR CUFF REPAIR Left 09/30/2014   thumb extraction Left 05/19/2016   left thumb "cut off accident"    Current Outpatient Medications  Medication Sig Dispense Refill   albuterol (VENTOLIN HFA) 108 (90 Base)  MCG/ACT inhaler Inhale 1 puff into the lungs every 4 (four) hours as needed for wheezing or shortness of breath.     alfuzosin (UROXATRAL) 10 MG 24 hr tablet Take 10 mg by mouth daily.     atorvastatin (LIPITOR) 40 MG tablet Take 1 tablet (40 mg total) by mouth daily. 90 tablet 3   Azelastine HCl 137 MCG/SPRAY SOLN Place 1 spray into the nose 2 (two) times daily.     diltiazem (CARDIZEM) 30 MG tablet Take 1 tablet (30 mg total) by mouth as needed for up to 1 dose (persistent palpitations). 10 tablet 0   levothyroxine (SYNTHROID, LEVOTHROID) 50 MCG tablet Take 50 mcg by mouth daily.      Magnesium 400 MG TABS Take 1 tablet by mouth daily.     nortriptyline (PAMELOR) 10 MG capsule Take 10 mg by mouth at bedtime.      No current facility-administered medications for this visit.    Allergies as of 12/27/2023 - Review Complete 12/27/2023  Allergen Reaction Noted   Latex Itching and Rash 08/04/2015   Benadryl [diphenhydramine hcl]  09/19/2011    Vitals: BP 124/69   Pulse 71   Ht 5\' 8"  (1.727 m)   Wt 178 lb (80.7 kg)   BMI 27.06 kg/m  Last Weight:  Wt Readings from Last 1 Encounters:  12/27/23 178 lb (80.7 kg)   Last Height:   Ht Readings from Last 1 Encounters:  12/27/23 5\' 8"  (1.727 m)   Last BMI: @LASTBMI  Physical exam:  General: The patient is awake, alert and  appears not in acute distress.  The patient is well groomed. Head: Normocephalic, atraumatic.  Neck is supple.   Neck circumference:17 Cardiovascular:  Regular rate and palpable peripheral pulse:  Respiratory: clear to auscultation.  Mallampati : 2 , Skin:  Without evidence of edema, or rash Trunk: BMI is 27   and patient  has normal posture.   Neurologic exam : The patient is awake and alert, oriented to place and time.  Memory subjective  described as impaired  . mini-mental status exam    12/27/2023    2:25 PM  Montreal Cognitive Assessment   Visuospatial/ Executive (0/5) 3  Naming (0/3) 3  Attention: Read list of digits (0/2) 1  Attention: Read list of letters (0/1) 1  Attention: Serial 7 subtraction starting at 100 (0/3) 3  Language: Repeat phrase (0/2) 2  Language : Fluency (0/1) 1  Abstraction (0/2) 2  Delayed Recall (0/5) 3  Orientation (0/6) 6  Total 25    There is a normal attention span & concentration ability.  Speech is  not fluent without  dysarthria, dysphonia .Marland Kitchen  Mood and affect are appropriate.  Cranial nerves: Pupils are equal and briskly reactive to light.  Funduscopic exam with evidence  cataract , not of pallor or edema. Extraocular movements  in vertical and horizontal planes intact and without nystagmus. Visual fields by finger perimetry are intact. Hearing to finger rub intact.  Facial sensation intact to fine touch. Facial motor strength is symmetric and tongue and uvula move midline.  Motor exam:   Normal tone and normal muscle bulk and symmetric normal strength in all extremities. Grip Strength equal .  Proximal strength of shoulder muscles and hip flexors was intact .  Sensory:  Fine touch and vibration were tested . Proprioception was tested in the upper extremities only and was normal.  Coordination: Rapid alternating movements in the fingers/hands were normal.  Finger-to-nose maneuver was tested and  showed no evidence of ataxia, dysmetria  or tremor.  Gait and station: Patient walked without assistive device .  Core Strength within normal limits. Stance is stable and of normal base.  Tandem gait is intact , turns with 3 Steps - unfragmented.  Romberg testing is normal. Can stand on one leg   Deep tendon reflexes: in the  upper and lower extremities are symmetric . Babinski maneuver response is  downgoing.   Assessment: Total time for face to face interview and examination, for review of  images and laboratory testing, neurophysiology testing and pre-existing records, including out-of -network , was 45 minutes.  Assessment is as follows here:  1)  MCI with amnestic events.  2)  visio-spatial dysfunction.   Intermittent Aphasia. 3) family histiyr of AD, PGM and cousin  4) anxiety - off Xanax and now on nortriptyline .   Plan:  Treatment plan and additional workup planned after today includes:     What I will add today is an inflammatory panel, I will add today the so-called ATN the Alzheimer disease tangles and neuro felt febrile febrile and testing I also want to make sure that we are eliminating medications that have negative side effects for memory in the long-term.  1)  I agree with the referral to neuropsychology/  Start with aricept  5 mg a day.  2) ATN, AD risk.   3) based on ATN outcome will pursue amyloid scan PET , higher risk of  micro-bleeds discussed, may  need to retest with another MRI f before  considering infusion therapy.    Neomia Banner, MD      Neomia Banner, MD  [facility] and [facility] Sleep Board certified by The [employer] of Sleep Medicine and Diplomate of the [employer] of Sleep Medicine. Board certified In Neurology through the ABPN, Fellow of the [employer] of Neu[employer].

## 2023-12-27 NOTE — Patient Instructions (Signed)
 Problems With Thinking and Memory (Mild Neurocognitive Disorder): What to Know Mild neurocognitive disorder, formerly known as mild cognitive impairment, is a disorder where your memory doesn't work as well as it should. It may also affect other mental abilities like thinking, communicating, behavior, and being able to finish tasks. These problems can be noticed and measured. But they usually don't stop you from doing daily activities or living on your own. Mild neurocognitive disorder usually happens after 74 years of age. But it can also happen at younger ages. It's not as serious as major neurocognitive disorder, also known as dementia, but it may be the first sign of it. In general, the symptoms of this condition get worse over time. In rare cases, symptoms can get better. What are the causes? This condition may be caused by: Brain disorders like Alzheimer's disease, Parkinson's disease, and other conditions that slowly damage nerve cells. Diseases that affect the blood vessels in the brain and cause small strokes. Certain infections, like HIV. Traumatic brain injury. Other medical conditions, such as brain tumors, underactive thyroid (hypothyroidism), and not having enough vitamin B12. Using certain drugs or medicines. What increases the risk? Being older than 74 years of age. Being male. Having a lower level of education. Diabetes, high blood pressure, high cholesterol, and other conditions that raise the risk for blood vessel diseases. Untreated or undertreated sleep apnea. Having a certain type of gene that can be inherited, or passed down from parent to child. Long-term health problems like heart disease, lung disease, liver disease, kidney disease, or depression. What are the signs or symptoms? Trouble remembering things. You may: Forget names, phone numbers, or details of recent events. Forget about social events and appointments. Often forget where you put your car keys or other  items. Trouble thinking and solving problems. You may have trouble with complex tasks like: Paying bills. Driving in places you don't know well. Trouble communicating. You may have trouble: Finding the right word or naming an object. Forming a sentence that makes sense. Understanding what you read or hear. Changes in your behavior or personality. When this happens, you may: Lose interest in the things you used to enjoy. Avoid being around people. Get angry more easily than usual. Act before thinking. How is this diagnosed? This condition is diagnosed based on: Your symptoms. Your health care provider may ask you and the people you spend time with, like family and friends, about your symptoms. Memory tests and other tests to check how your brain is working. Your provider may refer you to a provider called a neurologist or a mental health specialist. To try to find out the cause of your condition, your provider may: Get a detailed medical history. Ask about use of alcohol, drugs, and medicines. Do a physical exam. Order blood tests and brain imaging tests. How is this treated? Mild neurocognitive disorder that's caused by medicine use, drug use, infection, or another medical condition may get better when the cause is treated, or when medicines or drugs are stopped. If this disorder has another cause, it usually doesn't improve and may get worse. In these cases, the goal of treatment is to help you manage the symptoms. This may include: Medicines to help with memory and behavior symptoms. Talk therapy. This provides education, emotional support, memory aids, and other ways of making up for problems with mental tasks. Lifestyle changes. These may include: Getting regular exercise. Eating a healthy diet that includes omega-3 fatty acids. Doing things to challenge your thinking  and memory skills. Spending more time being with and talking to other people. Using routines like having regular  times for meals and going to bed. Follow these instructions at home: Eating and drinking  Drink more fluids as told. Eat a healthy diet that includes omega-3 fatty acids. These can be found in: Fish. Nuts. Leafy vegetables. Vegetable oils. If you drink alcohol: Limit how much you have to: 0-1 drink a day if you're male. 0-2 drinks a day if you're male. Know how much alcohol is in your drink. In the [place] one drink is one 12 oz bottle of beer (355 mL), one 5 oz glass of wine (148 mL), or one 1 oz glass of hard liquor (44 mL). Lifestyle  Get regular exercise as told by your provider. Do not smoke, vape, or use nicotine or tobacco. Use healthy ways to manage stress. If you need help managing stress, ask your provider. Keep spending time with other people. Keep your mind active by doing activities you enjoy, like reading or playing games. Make sure you get good sleep at night. These tips can help: Try not to take naps during the day. Keep your bedroom dark and cool. Do not exercise in the few hours before you go to bed. Do not have foods or drinks with caffeine at night. General instructions Take medicines only as told. Your provider may tell you to avoid taking medicines that can affect thinking. These include some medicines for pain or sleeping. Work with your provider to find out: What things you need help with. What your safety needs are. Where to find more information [employer] on Aging: [email or web address] Contact a health care provider if: You have any new symptoms. Get help right away if: You have new confusion or your confusion gets worse. You act in ways that put you or your family in danger. This information is not intended to replace advice given to you by your health care provider. Make sure you discuss any questions you have with your health care provider. Document Revised: 02/20/2023 Document Reviewed: 02/20/2023 Elsevier Patient Education  2024 [employer].

## 2024-01-03 ENCOUNTER — Encounter: Payer: Self-pay | Admitting: Neurology

## 2024-01-08 LAB — APOE ALZHEIMER'S RISK

## 2024-01-08 LAB — ATN PROFILE
A -- Beta-amyloid 42/40 Ratio: 0.103 (ref >0.102)
Beta-amyloid 40: 168.97 pg/mL
Beta-amyloid 42: 17.37 pg/mL
N -- NfL, Plasma: 2.7 pg/mL (ref 0.00–7.64)
T -- p-tau181: 1.47 pg/mL — ABNORMAL HIGH (ref 0.00–0.97)

## 2024-01-08 LAB — PROTEIN ELECTROPHORESIS, SERUM
A/G Ratio: 1.8 — ABNORMAL HIGH (ref 0.7–1.7)
Albumin ELP: 4.1 g/dL (ref 2.9–4.4)
Alpha 1: 0.2 g/dL (ref 0.0–0.4)
Alpha 2: 0.5 g/dL (ref 0.4–1.0)
Beta: 0.9 g/dL (ref 0.7–1.3)
Gamma Globulin: 0.7 g/dL (ref 0.4–1.8)
Globulin, Total: 2.3 g/dL (ref 2.2–3.9)
Total Protein: 6.4 g/dL (ref 6.0–8.5)

## 2024-01-08 LAB — ANA W/REFLEX: Anti Nuclear Antibody (ANA): NEGATIVE

## 2024-01-08 LAB — HEPATITIS B CORE ANTIBODY, TOTAL: Hep B Core Total Ab: NEGATIVE

## 2024-01-08 LAB — HOMOCYSTEINE: Homocysteine: 10.8 umol/L (ref 0.0–19.2)

## 2024-01-08 LAB — RPR: RPR Ser Ql: NONREACTIVE

## 2024-01-08 LAB — SEDIMENTATION RATE: Sed Rate: 2 mm/h (ref 0–30)

## 2024-01-10 ENCOUNTER — Encounter: Payer: Self-pay | Admitting: Neurology

## 2024-01-17 DIAGNOSIS — R1013 Epigastric pain: Secondary | ICD-10-CM | POA: Diagnosis not present

## 2024-01-17 DIAGNOSIS — M62838 Other muscle spasm: Secondary | ICD-10-CM | POA: Diagnosis not present

## 2024-01-17 DIAGNOSIS — R110 Nausea: Secondary | ICD-10-CM | POA: Diagnosis not present

## 2024-01-17 LAB — BASIC METABOLIC PANEL WITH GFR
BUN: 19 (ref 4–21)
CO2: 28 — AB (ref 13–22)
Chloride: 107 (ref 99–108)
Creatinine: 1 (ref 0.6–1.3)
Glucose: 94
Potassium: 4 meq/L (ref 3.5–5.1)
Sodium: 140 (ref 137–147)

## 2024-01-17 LAB — COMPREHENSIVE METABOLIC PANEL WITH GFR
Albumin: 4.3 (ref 3.5–5.0)
Calcium: 9 (ref 8.7–10.7)
eGFR: 76

## 2024-01-17 LAB — HEPATIC FUNCTION PANEL
ALT: 16 U/L (ref 10–40)
AST: 24 (ref 14–40)
Alkaline Phosphatase: 48 (ref 25–125)
Bilirubin, Total: 0.8

## 2024-01-18 ENCOUNTER — Other Ambulatory Visit: Payer: Self-pay | Admitting: Neurology

## 2024-01-21 ENCOUNTER — Other Ambulatory Visit: Payer: Self-pay | Admitting: Physician Assistant

## 2024-01-21 DIAGNOSIS — R1013 Epigastric pain: Secondary | ICD-10-CM

## 2024-01-23 ENCOUNTER — Other Ambulatory Visit: Payer: Self-pay | Admitting: Physician Assistant

## 2024-01-23 ENCOUNTER — Ambulatory Visit

## 2024-01-23 DIAGNOSIS — R1013 Epigastric pain: Secondary | ICD-10-CM

## 2024-02-05 DIAGNOSIS — R110 Nausea: Secondary | ICD-10-CM | POA: Diagnosis not present

## 2024-02-05 DIAGNOSIS — R109 Unspecified abdominal pain: Secondary | ICD-10-CM | POA: Diagnosis not present

## 2024-02-05 DIAGNOSIS — R194 Change in bowel habit: Secondary | ICD-10-CM | POA: Diagnosis not present

## 2024-02-10 HISTORY — PX: COLONOSCOPY: SHX174

## 2024-02-11 ENCOUNTER — Other Ambulatory Visit: Payer: Self-pay | Admitting: Neurology

## 2024-02-13 DIAGNOSIS — R197 Diarrhea, unspecified: Secondary | ICD-10-CM | POA: Diagnosis not present

## 2024-02-13 DIAGNOSIS — K5730 Diverticulosis of large intestine without perforation or abscess without bleeding: Secondary | ICD-10-CM | POA: Diagnosis not present

## 2024-02-13 DIAGNOSIS — R194 Change in bowel habit: Secondary | ICD-10-CM | POA: Diagnosis not present

## 2024-02-13 DIAGNOSIS — R1084 Generalized abdominal pain: Secondary | ICD-10-CM | POA: Diagnosis not present

## 2024-02-13 DIAGNOSIS — K648 Other hemorrhoids: Secondary | ICD-10-CM | POA: Diagnosis not present

## 2024-02-13 DIAGNOSIS — K2930 Chronic superficial gastritis without bleeding: Secondary | ICD-10-CM | POA: Diagnosis not present

## 2024-02-15 DIAGNOSIS — K2930 Chronic superficial gastritis without bleeding: Secondary | ICD-10-CM | POA: Diagnosis not present

## 2024-02-18 ENCOUNTER — Other Ambulatory Visit: Payer: Self-pay | Admitting: Gastroenterology

## 2024-02-18 DIAGNOSIS — R197 Diarrhea, unspecified: Secondary | ICD-10-CM

## 2024-02-18 DIAGNOSIS — R109 Unspecified abdominal pain: Secondary | ICD-10-CM

## 2024-02-19 DIAGNOSIS — R197 Diarrhea, unspecified: Secondary | ICD-10-CM | POA: Diagnosis not present

## 2024-02-21 ENCOUNTER — Ambulatory Visit
Admission: RE | Admit: 2024-02-21 | Discharge: 2024-02-21 | Disposition: A | Discharge status: Home / Self Care | Source: Ambulatory Visit | Attending: Gastroenterology | Admitting: Gastroenterology

## 2024-02-21 DIAGNOSIS — K5730 Diverticulosis of large intestine without perforation or abscess without bleeding: Secondary | ICD-10-CM | POA: Diagnosis not present

## 2024-02-21 DIAGNOSIS — R197 Diarrhea, unspecified: Secondary | ICD-10-CM

## 2024-02-21 DIAGNOSIS — I700 Atherosclerosis of aorta: Secondary | ICD-10-CM | POA: Diagnosis not present

## 2024-02-21 DIAGNOSIS — R109 Unspecified abdominal pain: Secondary | ICD-10-CM

## 2024-02-21 MED ORDER — IOPAMIDOL (ISOVUE-300) INJECTION 61%
100.0000 mL | Freq: Once | INTRAVENOUS | Status: AC | PRN
  Administered 2024-02-21: 100 mL via INTRAVENOUS

## 2024-02-22 DIAGNOSIS — J3089 Other allergic rhinitis: Secondary | ICD-10-CM | POA: Diagnosis not present

## 2024-02-22 DIAGNOSIS — H6121 Impacted cerumen, right ear: Secondary | ICD-10-CM | POA: Diagnosis not present

## 2024-02-22 DIAGNOSIS — R110 Nausea: Secondary | ICD-10-CM | POA: Diagnosis not present

## 2024-02-28 ENCOUNTER — Encounter: Payer: Self-pay | Admitting: Allergy

## 2024-02-28 ENCOUNTER — Ambulatory Visit: Admitting: Allergy

## 2024-02-28 VITALS — BP 118/68 | HR 62 | Temp 98.0°F | Resp 16 | Ht 67.32 in | Wt 175.0 lb

## 2024-02-28 DIAGNOSIS — T781XXD Other adverse food reactions, not elsewhere classified, subsequent encounter: Secondary | ICD-10-CM | POA: Diagnosis not present

## 2024-02-28 DIAGNOSIS — R198 Other specified symptoms and signs involving the digestive system and abdomen: Secondary | ICD-10-CM

## 2024-02-28 DIAGNOSIS — J3089 Other allergic rhinitis: Secondary | ICD-10-CM

## 2024-02-28 MED ORDER — IPRATROPIUM BROMIDE 0.03 % NA SOLN: 1.0000 | Freq: Two times a day (BID) | NASAL | 5 refills | Status: DC | PRN

## 2024-02-28 NOTE — Patient Instructions (Addendum)
 Abdominal pain.  I don't think this is food allergy related. Keep track of symptoms and what you have done/eaten that day. I can order some bloodwork to rule out alpha gal allergy and top common foods just in case.   Possible environmental allergies with post nasal drip Use Atrovent (ipratropium) 0.03% 1-2 sprays per nostril twice a day as needed for runny nose/drainage.  Get bloodwork We are ordering labs, so please allow 1-2 weeks for the results to come back. With the newly implemented Cures Act, the labs might be visible to you at the same time that they become visible to me. However, I will not address the results until all of the results are back, so please be patient.  In the meantime, continue recommendations in your patient instructions, including avoidance measures (if applicable), until you hear from me.  Follow up as needed. Follow up with GI as scheduled.

## 2024-02-28 NOTE — Progress Notes (Signed)
 New Patient Note  RE: Marc Benson MRN: [id number] DOB: Jan 21, 1951 Date of Office Visit: 02/28/2024  Consult requested by: Jennifer Moellers, PA Primary care provider: Wyn Heater, MD  Chief Complaint: Allergies (Has been sick since April - Nausea,Vomiting and upset stomach - has had an ultrasound/endoscopy /)  History of Present Illness: I had the pleasure of seeing Marc Benson for initial evaluation at the [facility] on 02/28/2024. He is a 73 y.o. male, who is referred here by Wyn Heater, MD for the evaluation of allergies.   Discussed the use of AI scribe software for clinical note transcription with the patient, who gave verbal consent to proceed.    He has been experiencing persistent nausea and stomach pain for approximately 12 weeks. The nausea is constant, with severity ranging from 4 to 5 out of 10, occasionally escalating to 7 or 8. The stomach pain has reached a severity of 9 out of 10 at its worst. Symptoms occur regardless of food intake and are present upon waking. Recently, there has been some improvement, with symptoms reducing to a 2 or 3 out of 10 over the past three to four days. No vomiting, diarrhea, or constipation. No recent changes in diet or eating habits and no weight loss. An extensive workup including an ultrasound of the pancreas, bladder, and liver, an endoscopy, a colonoscopy, and tests for exocrine pancreatic insufficiency returned normal results. He is currently taking a stomach medication, possibly a proton pump inhibitor, but has not noticed significant improvement but has been feeling better the past week.   He has a history of postnasal drip for over 30 years, with recent changes in nasal symptoms, including increased sneezing and drainage over the past few weeks, described as clear. He has been compliant with prescribed treatments, including nasal sprays, but has not found them effective.  He reports new onset of severe leg  cramps, which he has not experienced before. He continues to take his regular medications for blood pressure, cholesterol, and thyroid  without any recent changes.  No recent illnesses, fevers, chills, or tick bites. No diagnosis of asthma or known allergies to foods, medications, or environmental factors, except for a reaction to Benadryl, which causes his heart to race. No significant changes in blood pressure or thyroid  function.      01/23/24 US : IMPRESSION: 1. No acute abnormality identified. 2. Increased echotexture of the liver. This is a nonspecific finding but can be seen in fatty infiltration of liver.  02/21/2024 CT A/P: IMPRESSION: 1. No acute findings in the abdomen or pelvis. Specifically, no findings to explain the patient's history of abdominal pain. 2. Advanced diverticular disease in the mid sigmoid colon without complicating features. 3. Enlarged and heterogeneous prostate gland. 4.  Aortic Atherosclerosis (ICD10-I70.0).   Assessment and Plan: Jermal is a 73 y.o. male with: Gastrointestinal complaints Chronic nausea and stomach pain since April, severity 4-9/10. Previous evaluations unremarkable by GI. PPI ineffective per patient. Symptoms persistent, not food-related.  Unlikely related to food or environmental allergies. Will rule out alpha-gal/food allergy via bloodwork.  Keep track of symptoms and what you have done/eaten that day.  Other allergic rhinitis Some PND x 30 yrs. Noted increased drainage as well. Will check environmental panel via bloodwork.  Use Atrovent (ipratropium) 0.03% 1-2 sprays per nostril twice a day as needed for runny nose/drainage.  Follow up with GI and PCP as scheduled.   Return if symptoms worsen or fail to improve.  Meds ordered  this encounter  Medications   ipratropium (ATROVENT) 0.03 % nasal spray    Sig: Place 1-2 sprays into both nostrils 2 (two) times daily as needed (nasal drainage).    Dispense:  30 mL    Refill:  5    Lab Orders         Alpha-Gal Panel         Food Allergy Profile         Allergens w/Total IgE Area 2      Other allergy screening: Asthma: no Rhino conjunctivitis: no Food allergy: no Medication allergy: no Hymenoptera allergy: no Urticaria: no Eczema:no History of recurrent infections suggestive of immunodeficency: no  Diagnostics: None.   Past Medical History: Patient Active Problem List   Diagnosis Date Noted   Mild cognitive impairment of uncertain or unknown etiology 12/27/2023   GAD (generalized anxiety disorder) 12/27/2023   PAC (premature atrial contraction) 10/08/2019   Anxiety state 05/14/2009   Essential hypertension 05/14/2009   PAROXYSMAL ATRIAL FIBRILLATION 05/14/2009   PVC's (premature ventricular contractions) 05/14/2009   SLEEP DISORDER, HX OF 05/14/2009   Past Medical History:  Diagnosis Date   ANXIETY 05/14/2009   Hypothyroidism    Mixed hyperlipidemia    PAROXYSMAL ATRIAL FIBRILLATION 05/14/2009   s/p PVI x 2 [facility]   PREMATURE VENTRICULAR CONTRACTIONS 05/14/2009   SLEEP DISORDER, HX OF 05/14/2009   Past Surgical History: Past Surgical History:  Procedure Laterality Date   ABLATION     CARDIAC CATHETERIZATION  11/18/2004   head surgery  Left 2015   BCC removed from Lt side of head- Dermatologist Dr Harlen Lick   INGUINAL HERNIA REPAIR Right 2002   ROTATOR CUFF REPAIR Left 09/30/2014   thumb extraction Left 05/19/2016   left thumb cut off accident   Medication List:  Current Outpatient Medications  Medication Sig Dispense Refill   alfuzosin (UROXATRAL) 10 MG 24 hr tablet Take 10 mg by mouth daily.     aspirin EC 81 MG tablet 1 tablet Orally Once a day     atorvastatin  (LIPITOR) 40 MG tablet Take 1 tablet (40 mg total) by mouth daily. 90 tablet 3   diltiazem  (CARDIZEM ) 30 MG tablet Take 1 tablet (30 mg total) by mouth as needed for up to 1 dose (persistent palpitations). 10 tablet 0   donepezil  (ARICEPT ) 5 MG tablet TAKE 1 TABLET BY  MOUTH AT BEDTIME 30 tablet 2   ipratropium (ATROVENT) 0.03 % nasal spray Place 1-2 sprays into both nostrils 2 (two) times daily as needed (nasal drainage). 30 mL 5   levothyroxine (SYNTHROID, LEVOTHROID) 50 MCG tablet Take 50 mcg by mouth daily.      Magnesium 400 MG TABS Take 1 tablet by mouth daily.     omeprazole (PRILOSEC) 40 MG capsule 1 capsule 1/2 to 1 hour before morning meal Orally Once a day for 30 days     tiZANidine (ZANAFLEX) 4 MG tablet 1 tablet at bedtime as needed Orally Once a day for 30 days     nortriptyline (PAMELOR) 10 MG capsule Take 10 mg by mouth at bedtime.      No current facility-administered medications for this visit.   Allergies: Allergies  Allergen Reactions   Latex Itching and Rash    LATEX TAPE   Benadryl [Diphenhydramine Hcl]    Social History: Social History   Socioeconomic History   Marital status: Married    Spouse name: Not on file   Number of children: Not on file   Years  of education: Not on file   Highest education level: Not on file  Occupational History   Not on file  Tobacco Use   Smoking status: Former    Current packs/day: 0.00    Types: Cigarettes    Quit date: 09/19/1971    Years since quitting: 52.4   Smokeless tobacco: Never  Vaping Use   Vaping status: Never Used  Substance and Sexual Activity   Alcohol use: No   Drug use: No   Sexual activity: Not on file  Other Topics Concern   Not on file  Social History Narrative   Not on file   Social Drivers of Health   Financial Resource Strain: Not on file  Food Insecurity: Not on file  Transportation Needs: Not on file  Physical Activity: Not on file  Stress: Not on file  Social Connections: Unknown (01/20/2022)   [facility]   Social Network    Social Network: Not on file   Lives in a house. Smoking: denies Occupation: retired  Landscape architect HistorySurveyor, minerals in the house: no Engineer, civil (consulting) in the family room: no Carpet in the bedroom:  yes Heating: electric Cooling: central Pet: no  Family History: Family History  Problem Relation Age of Onset   Aneurysm Mother    Heart attack Father    Problem                               Relation Asthma                                   no Eczema                                no Food allergy                          no Allergic rhino conjunctivitis     no  Review of Systems  Constitutional:  Negative for appetite change, chills, fever and unexpected weight change.  HENT:  Positive for postnasal drip and rhinorrhea. Negative for congestion.   Eyes:  Negative for itching.  Respiratory:  Negative for cough, chest tightness, shortness of breath and wheezing.   Cardiovascular:  Negative for chest pain.  Gastrointestinal:  Positive for abdominal pain and nausea. Negative for constipation and diarrhea.  Genitourinary:  Negative for difficulty urinating.  Skin:  Negative for rash.  Neurological:  Negative for headaches.    Objective: BP 118/68   Pulse 62   Temp 98 F (36.7 C)   Resp 16   Ht 5' 7.32 (1.71 m)   Wt 175 lb (79.4 kg)   SpO2 97%   BMI 27.15 kg/m  Body mass index is 27.15 kg/m. Physical Exam Vitals and nursing note reviewed.  Constitutional:      Appearance: Normal appearance. He is well-developed.  HENT:     Head: Normocephalic and atraumatic.     Right Ear: Tympanic membrane and external ear normal.     Left Ear: Tympanic membrane and external ear normal.     Nose: Nose normal.     Mouth/Throat:     Mouth: Mucous membranes are moist.     Pharynx: Oropharynx is clear.   Eyes:     Conjunctiva/sclera: Conjunctivae normal.  Cardiovascular:     Rate and Rhythm: Normal rate and regular rhythm.     Heart sounds: Normal heart sounds. No murmur heard.    No friction rub. No gallop.  Pulmonary:     Effort: Pulmonary effort is normal.     Breath sounds: Normal breath sounds. No wheezing, rhonchi or rales.   Musculoskeletal:     Cervical back: Neck  supple.   Skin:    General: Skin is warm.     Findings: No rash.   Neurological:     Mental Status: He is alert and oriented to person, place, and time.   Psychiatric:        Behavior: Behavior normal.   The plan was reviewed with the patient/family, and all questions/concerned were addressed.  It was my pleasure to see Helios today and participate in his care. Please feel free to contact me with any questions or concerns.  Sincerely,  Eudelia Hero, DO Allergy & Immunology  [facility]  [facility] office: [phone] [facility] office: [phone]

## 2024-02-29 DIAGNOSIS — R197 Diarrhea, unspecified: Secondary | ICD-10-CM | POA: Diagnosis not present

## 2024-03-18 DIAGNOSIS — M25562 Pain in left knee: Secondary | ICD-10-CM | POA: Diagnosis not present

## 2024-03-18 DIAGNOSIS — R197 Diarrhea, unspecified: Secondary | ICD-10-CM | POA: Diagnosis not present

## 2024-03-18 DIAGNOSIS — R109 Unspecified abdominal pain: Secondary | ICD-10-CM | POA: Diagnosis not present

## 2024-03-18 DIAGNOSIS — M25561 Pain in right knee: Secondary | ICD-10-CM | POA: Diagnosis not present

## 2024-03-18 DIAGNOSIS — R110 Nausea: Secondary | ICD-10-CM | POA: Diagnosis not present

## 2024-03-18 DIAGNOSIS — M2352 Chronic instability of knee, left knee: Secondary | ICD-10-CM | POA: Diagnosis not present

## 2024-03-18 DIAGNOSIS — G8929 Other chronic pain: Secondary | ICD-10-CM | POA: Diagnosis not present

## 2024-03-29 DIAGNOSIS — S83272A Complex tear of lateral meniscus, current injury, left knee, initial encounter: Secondary | ICD-10-CM | POA: Diagnosis not present

## 2024-04-08 DIAGNOSIS — G8929 Other chronic pain: Secondary | ICD-10-CM | POA: Diagnosis not present

## 2024-04-08 DIAGNOSIS — M25562 Pain in left knee: Secondary | ICD-10-CM | POA: Diagnosis not present

## 2024-04-08 DIAGNOSIS — M25561 Pain in right knee: Secondary | ICD-10-CM | POA: Diagnosis not present

## 2024-04-16 DIAGNOSIS — X58XXXA Exposure to other specified factors, initial encounter: Secondary | ICD-10-CM | POA: Diagnosis not present

## 2024-04-16 DIAGNOSIS — Y999 Unspecified external cause status: Secondary | ICD-10-CM | POA: Diagnosis not present

## 2024-04-16 DIAGNOSIS — M2242 Chondromalacia patellae, left knee: Secondary | ICD-10-CM | POA: Diagnosis not present

## 2024-04-16 DIAGNOSIS — S83232A Complex tear of medial meniscus, current injury, left knee, initial encounter: Secondary | ICD-10-CM | POA: Diagnosis not present

## 2024-04-16 DIAGNOSIS — G8918 Other acute postprocedural pain: Secondary | ICD-10-CM | POA: Diagnosis not present

## 2024-04-16 DIAGNOSIS — M65862 Other synovitis and tenosynovitis, left lower leg: Secondary | ICD-10-CM | POA: Diagnosis not present

## 2024-04-16 DIAGNOSIS — M6752 Plica syndrome, left knee: Secondary | ICD-10-CM | POA: Diagnosis not present

## 2024-04-16 DIAGNOSIS — M1712 Unilateral primary osteoarthritis, left knee: Secondary | ICD-10-CM | POA: Diagnosis not present

## 2024-04-16 DIAGNOSIS — S83272A Complex tear of lateral meniscus, current injury, left knee, initial encounter: Secondary | ICD-10-CM | POA: Diagnosis not present

## 2024-04-16 DIAGNOSIS — M65962 Unspecified synovitis and tenosynovitis, left lower leg: Secondary | ICD-10-CM | POA: Diagnosis not present

## 2024-04-22 DIAGNOSIS — Z9889 Other specified postprocedural states: Secondary | ICD-10-CM | POA: Diagnosis not present

## 2024-04-24 DIAGNOSIS — I480 Paroxysmal atrial fibrillation: Secondary | ICD-10-CM | POA: Diagnosis not present

## 2024-04-28 DIAGNOSIS — L84 Corns and callosities: Secondary | ICD-10-CM | POA: Diagnosis not present

## 2024-04-28 DIAGNOSIS — L812 Freckles: Secondary | ICD-10-CM | POA: Diagnosis not present

## 2024-04-28 DIAGNOSIS — D225 Melanocytic nevi of trunk: Secondary | ICD-10-CM | POA: Diagnosis not present

## 2024-04-28 DIAGNOSIS — L821 Other seborrheic keratosis: Secondary | ICD-10-CM | POA: Diagnosis not present

## 2024-04-30 NOTE — Progress Notes (Unsigned)
 Electrophysiology Office Note:   Date:  05/02/2024  ID:  Athen, Riel [birth year], MRN [id number]  Primary Cardiologist: None Primary Heart Failure: None Electrophysiologist: Fonda Kitty, MD  > Dr. Kitty      History of Present Illness:   Marc Benson is a 73 y.o. male with h/o AF s/p PVI x2 in 2007, PAC's / PVC's, HTN, anxiety seen today for routine electrophysiology followup.   Since last being seen in our clinic the patient reports he has had episodes of AF over the last year. His most recent episode was July 19th lasting 2 or more hours. He wears an Scientist, physiological.  He notes he has had short lived episodes lasting 30 seconds but the episode in July bothered him and he felt poorly after. He describes it as if the light was turned out on his energy level. He is hopeful to avoid further AF & being on medications. He asks about repeat ablation.    He denies chest pain, palpitations, dyspnea, PND, orthopnea, nausea, vomiting, dizziness, syncope, edema, weight gain, or early satiety.   Review of systems complete and found to be negative unless listed in HPI.   EP Information / Studies Reviewed:    EKG is ordered today. Personal review as below.  EKG Interpretation Date/Time:  Friday May 02 2024 08:13:08 EDT Ventricular Rate:  46 PR Interval:  164 QRS Duration:  98 QT Interval:  460 QTC Calculation: 402 R Axis:   52  Text Interpretation: Sinus bradycardia with sinus arrhythmia Confirmed by Aniceto Jarvis ([id number]) on 05/02/2024 8:22:47 AM   Arrhythmia  PAF s/p PVI x2 - March & Nov 2007. Has been off coumadin at least since 2015 2019 - intolerant to metoprolol  for palpitations PAC's 10/19 event recorder for palpitations demonstrated recurrent tachycardia notably both SVT as well as ventricular tachycardia-nonsustained-14 beats    Studies: ECHO 2019 > LEF 5-55%, no RWMA, AV severely thickened, severely calcified, trivial TVR, trivial PVR CT Coronary 09/2018 > CCS 313 / 68th  percentile for age, normal coronary origin with right dominance, mild non-obs disease in all three coronaries and the left main    Risk Assessment/Calculations:    CHA2DS2-VASc Score = 1   This indicates a 0.6% annual risk of stroke. The patient's score is based upon: CHF History: 0 HTN History: 0 Diabetes History: 0 Stroke History: 0 Vascular Disease History: 0 Age Score: 1 Gender Score: 0              Physical Exam:   VS:  BP 120/66 (BP Location: Left Arm, Patient Position: Sitting, Cuff Size: Normal)   Pulse 66   Ht 5' 8 (1.727 m)   Wt 174 lb (78.9 kg)   SpO2 98%   BMI 26.46 kg/m    Wt Readings from Last 3 Encounters:  05/02/24 174 lb (78.9 kg)  02/28/24 175 lb (79.4 kg)  12/27/23 178 lb (80.7 kg)     GEN: Well nourished, well developed in no acute distress NECK: No JVD; No carotid bruits CARDIAC: Regular rate and rhythm, no murmurs, rubs, gallops RESPIRATORY:  Clear to auscultation without rales, wheezing or rhonchi  ABDOMEN: Soft, non-tender, non-distended EXTREMITIES:  No edema; No deformity   ASSESSMENT AND PLAN:    Paroxysmal Atrial Fibrillation  PAC's / PVC's CHA2DS2-VASc 1, s/p PVI x2 at [facility] in 2007 -Cardizem  30 mg PRN for palpitations > discussed when to use, HR > 110 sustained  -pt would like to review possibility of redo ablation with  MD > he does not want to have further AF and would prefer to avoid medications if possible  -will have patient come back to visit Dr. Kennyth to discuss candidacy for ablation  -he understands that he would have to go back on OAC before the ablation and after for 3 months > he is also hopeful to avoid further long term anticoagulation  -no on OAC currently with relatively low AF burden   Non-Obstructive CAD  CCS 313 -no anginal symptoms   -atorvastatin  per Cardiology  -ASA 81mg  daily    Follow up with Dr. Kennyth as scheduled to review for candidacy for redo ablation   Signed, [name], NP-C,  AGACNP-BC [facility] - Electrophysiology  05/02/2024, 5:35 PM

## 2024-05-02 ENCOUNTER — Ambulatory Visit: Attending: Pulmonary Disease | Admitting: Pulmonary Disease

## 2024-05-02 ENCOUNTER — Encounter: Payer: Self-pay | Admitting: Pulmonary Disease

## 2024-05-02 VITALS — BP 120/66 | HR 66 | Ht 68.0 in | Wt 174.0 lb

## 2024-05-02 DIAGNOSIS — I493 Ventricular premature depolarization: Secondary | ICD-10-CM | POA: Diagnosis not present

## 2024-05-02 DIAGNOSIS — I480 Paroxysmal atrial fibrillation: Secondary | ICD-10-CM | POA: Diagnosis not present

## 2024-05-02 DIAGNOSIS — I491 Atrial premature depolarization: Secondary | ICD-10-CM | POA: Diagnosis not present

## 2024-05-02 NOTE — Patient Instructions (Signed)
 Medication Instructions:  Your physician recommends that you continue on your current medications as directed. Please refer to the Current Medication list given to you today.  *If you need a refill on your cardiac medications before your next appointment, please call your pharmacy*  Lab Work: None ordered If you have labs (blood work) drawn today and your tests are completely normal, you will receive your results only by: MyChart Message (if you have MyChart) OR A paper copy in the mail If you have any lab test that is abnormal or we need to change your treatment, we will call you to review the results.  Follow-Up: At [facility], you and your health needs are our priority.  As part of our continuing mission to provide you with exceptional heart care, our providers are all part of one team.  This team includes your primary Cardiologist (physician) and Advanced Practice Providers or APPs (Physician Assistants and Nurse Practitioners) who all work together to provide you with the care you need, when you need it.  Your next appointment:   Next available  Provider:   Fonda Kitty, MD to discuss redo ablation

## 2024-05-06 DIAGNOSIS — K080 Exfoliation of teeth due to systemic causes: Secondary | ICD-10-CM | POA: Diagnosis not present

## 2024-05-06 NOTE — Progress Notes (Unsigned)
 Patient: Marc Benson Date of Birth: 07/11/1951  Reason for Visit: Follow up History from: Patient Primary Neurologist: Dohmeier   ASSESSMENT AND PLAN 73 y.o. year old male   1.  Mild cognitive impairment 2.  Anxiety 3.  Paroxysmal atrial fibrillation  - MOCA 25/30 stable  - Referral for neuropsychological evaluation to better characterize memory deficit - Stop Aricept  for 1 to 2 weeks to see if muscle cramps improved, if no change restart - Recommend exercise, brain stimulating activity, healthy eating, drinking plenty of water, management of vascular risk factors to promote brain health - A-T+N-, E2/E4, hold off on further PET scan imaging. Will see results of neuropsych testing - He seems highly functional, independent, only once did I notice difficulty with word finding - Follow-up in 6 months with Dr. Chalice or sooner if needed  HISTORY OF PRESENT ILLNESS: Today 05/07/24 A-T+N-, E2/E4.  RPR, sed rate, ANA, protein electrophoresis, homocystine, hepatitis B core were normal. MOCA 25/30. Is alone today, he lives with his wife. Continues with word finding difficulty, finding the right word, isn't all the time. Talking with attorney today, couldn't find the right word for [facility]. Changing primary care doctors. Hasn't had any neuro psych test. He is completely independent. 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 Had episode of AFIB few weeks ago, not on blood thinner right now, may need ablation. Plays golf. Remains on Aricept  5 mg daily. He stopped nortriptyline for anxiety, stopped Xanax 2 years ago. Only once did I notice word finding trouble. Muscle cramps in his hands and legs since starting Aricept , his hands will draw up.   HISTORY  HPI:  Marc Benson is a 73 y.o. male and seen here upon referral from Dr. Cindy for a Consultation/ Evaluation of cognitive concerns. .   This patient reports onset of memory problems  over a period of 18 months    I  retired on disability for atrial fib and anxiety on 2003 , was on coumadin for 6 years until 6 years ago. No TBI.   Mr. Glymph reports that he has intermittent tachycardia and with heart rates up to 180 or 200 bpm he has a medication in form of Cardizem  a constant calcium  channel blocker available that he takes as needed as needed and he can take it up every hour and if the third dose is not correcting the ventricular tachycardia he is supposed to go to the emergency room he has rarely needed to take the Cardizem  however I was surprised to see that it is not a routine regular daily drug for him in order to prevent some of these spells he is also on Lipitor, Synthroid, magnesium 400 mg daily Pamelor and Flomax.  The MRI that was ordered through his primary care physician is already quoted below it showed only a lacunar and is otherwise normal.  There were some microvascular bleeds these hemochromatosis defects can be seen in patients that were on long-term anticoagulation as this patient has been.  They are not necessarily reflecting an amyloid angiopathy.  As of Dr. Clotilda Mesner's referral she stated that this is a 73 year old Caucasian male with more frequent and more progressive memory changes mostly in word finding word searching but also forgetting names of acquaintances and 18 months ago was about the first time he noticed this.  His imaging was pending at the time she wrote this note.  She had referred apparently to neuropsychology and  to our neurology office .  I appreciate her notes and the detail:   The patient does not report any visual or spatial changes but he did have some challenges with visual-spatial test questions on the [facility] cognitive assessment.  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  He had no previous known head injuries and no participation in contact sports, no  prolonged surgeries with prolonged anesthesia.  He had anxiety since young childhood and he had been on various medications but well-controlled on nortriptyline for many years.  He states that he has started supplementation with vitamin B12 and vitamin D and over the last 6 months, he has kept his weight and has always been cautious about weight gain.  He is aware of healthy lifestyles and has implemented those.  His BMI is 27.  He had a normal regular pulse and his blood pressure today was 124/69 mmHg.   The patient had extensive laboratory testing vitamin B12 was 544 hemoglobin A1c was normal at 5.4, total thyroid -stimulating hormone was tested but not T4, his metabolic panel was normal except for slight elevation in bilirubin 1.1 this is an elevation of about 1.0 and I am not sure how importance of this.  He had the MRI which showed an artifact shadow over the frontal lobe.     Fam Histoyr : first cousin with AD,  father : morbidly obese,  death by CAD MI at age 20, mother died of cerebral  aneurysm age 38 .   Paternal GF was morbidly obese.  Sister morbidly obese.      Labs reviewed:    IMPRESSION: of MRI at [facility] :    10/03/2023 :  1. Susceptibility artifact arising from the face region partially obscures the anteroinferior frontal lobes on the susceptibility-weighted sequence. Within this limitation, findings are as follows. 2. No evidence of an acute intracranial abnormality. 3.  No age-advanced or lobar predominant parenchymal atrophy. 4. 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 5. Background chronic small vessel ischemic changes which are moderate in the cerebral white matter and mild in the pons. 6. 15 mm left maxillary sinus mucous retention cyst.  REVIEW OF SYSTEMS: Out of a complete 14 system review of symptoms, the patient complains only of the following symptoms, and all other reviewed systems are negative.  See HPI  ALLERGIES: Allergies  Allergen  Reactions   Latex Itching and Rash    LATEX TAPE   Benadryl [Diphenhydramine Hcl]     HOME MEDICATIONS: Outpatient Medications Prior to Visit  Medication Sig Dispense Refill   alfuzosin (UROXATRAL) 10 MG 24 hr tablet Take 10 mg by mouth daily.     aspirin EC 81 MG tablet 1 tablet Orally Once a day     atorvastatin  (LIPITOR) 40 MG tablet Take 1 tablet (40 mg total) by mouth daily. 90 tablet 3   diltiazem  (CARDIZEM ) 30 MG tablet Take 1 tablet (30 mg total) by mouth as needed for up to 1 dose (persistent palpitations). 10 tablet 0   donepezil  (ARICEPT ) 5 MG tablet TAKE 1 TABLET BY MOUTH AT BEDTIME 30 tablet 2   ipratropium (ATROVENT ) 0.03 % nasal spray Place 1-2 sprays into both nostrils 2 (two) times daily as needed (nasal drainage). 30 mL 5   levothyroxine (SYNTHROID, LEVOTHROID) 50 MCG tablet Take 50 mcg by mouth daily.      Magnesium 400 MG TABS Take 1 tablet  by mouth daily.     omeprazole (PRILOSEC) 40 MG capsule 1 capsule 1/2 to 1 hour before morning meal Orally Once a day for 30 days     nortriptyline (PAMELOR) 10 MG capsule Take 10 mg by mouth at bedtime.      tiZANidine (ZANAFLEX) 4 MG tablet 1 tablet at bedtime as needed Orally Once a day for 30 days (Patient not taking: Reported on 05/07/2024)     No facility-administered medications prior to visit.    PAST MEDICAL HISTORY: Past Medical History:  Diagnosis Date   ANXIETY 05/14/2009   Hypothyroidism    Mixed hyperlipidemia    PAROXYSMAL ATRIAL FIBRILLATION 05/14/2009   s/p PVI x 2 [facility]   PREMATURE VENTRICULAR CONTRACTIONS 05/14/2009   SLEEP DISORDER, HX OF 05/14/2009    PAST SURGICAL HISTORY: Past Surgical History:  Procedure Laterality Date   ABLATION     CARDIAC CATHETERIZATION  11/18/2004   head surgery  Left 2015   BCC removed from Lt side of head- Dermatologist Dr Bard Molt   INGUINAL HERNIA REPAIR Right 2002   ROTATOR CUFF REPAIR Left 09/30/2014   thumb extraction Left 05/19/2016   left thumb cut off  accident    FAMILY HISTORY: Family History  Problem Relation Age of Onset   Aneurysm Mother    Heart attack Father     SOCIAL HISTORY: Social History   Socioeconomic History   Marital status: Married    Spouse name: Not on file   Number of children: Not on file   Years of education: Not on file   Highest education level: Not on file  Occupational History   Not on file  Tobacco Use   Smoking status: Former    Current packs/day: 0.00    Types: Cigarettes    Quit date: 09/19/1971    Years since quitting: 52.6   Smokeless tobacco: Never  Vaping Use   Vaping status: Never Used  Substance and Sexual Activity   Alcohol use: No   Drug use: No   Sexual activity: Not on file  Other Topics Concern   Not on file  Social History Narrative   Right handed   Lives with wife    Social Drivers of Health   Financial Resource Strain: Not on file  Food Insecurity: Not on file  Transportation Needs: Not on file  Physical Activity: Not on file  Stress: Not on file  Social Connections: Unknown (01/20/2022)   Received from [facility]   Social Network    Social Network: Not on file  Intimate Partner Violence: Unknown (12/12/2021)   Received from [facility]   HITS    Physically Hurt: Not on file    Insult or Talk Down To: Not on file    Threaten Physical Harm: Not on file    Scream or Curse: Not on file   PHYSICAL EXAM  Vitals:   05/07/24 1504  BP: 132/70  Pulse: 66  SpO2: 98%  Weight: 175 lb (79.4 kg)  Height: 5' 8 (1.727 m)   Body mass index is 26.61 kg/m.    05/07/2024    3:08 PM 12/27/2023    2:25 PM  Montreal Cognitive Assessment   Visuospatial/ Executive (0/5) 4 3  Naming (0/3) 2 3  Attention: Read list of digits (0/2) 2 1  Attention: Read list of letters (0/1) 1 1  Attention: Serial 7 subtraction starting at 100 (0/3) 3 3  Language: Repeat phrase (0/2) 2 2  Language : Fluency (0/1)  1 1  Abstraction (0/2) 2 2  Delayed Recall (0/5) 3 3  Orientation  (0/6) 5 6  Total 25 25    Generalized: Well developed, in no acute distress  Neurological examination  Mentation: Alert oriented to time, place, history taking. Follows all commands speech and language fluent Cranial nerve II-XII: Pupils were equal round reactive to light. Extraocular movements were full, visual field were full on confrontational test. Facial sensation and strength were normal. Uvula tongue midline. Head turning and shoulder shrug  were normal and symmetric. Motor: The motor testing reveals 5 over 5 strength of all 4 extremities. Good symmetric motor tone is noted throughout.  Sensory: Sensory testing is intact to soft touch on all 4 extremities. No evidence of extinction is noted.  Coordination: Cerebellar testing reveals good finger-nose-finger and heel-to-shin bilaterally.  Gait and station: Gait is normal.   DIAGNOSTIC DATA (LABS, IMAGING, TESTING) - I reviewed patient records, labs, notes, testing and imaging myself where available.  Lab Results  Component Value Date   WBC 6.1 07/10/2018   HGB 13.8 07/10/2018   HCT 41.1 07/10/2018   MCV 94 07/10/2018   PLT 253 07/10/2018      Component Value Date/Time   NA 142 08/29/2018 1515   K 4.5 08/29/2018 1515   CL 100 08/29/2018 1515   CO2 25 08/29/2018 1515   GLUCOSE 88 08/29/2018 1515   GLUCOSE 109 (H) 02/14/2017 1409   BUN 12 08/29/2018 1515   CREATININE 0.98 08/29/2018 1515   CALCIUM  9.4 08/29/2018 1515   PROT 6.4 12/27/2023 1530   ALBUMIN 4.4 07/10/2018 1453   AST 26 07/10/2018 1453   ALT 21 07/10/2018 1453   ALKPHOS 46 07/10/2018 1453   BILITOT 0.5 07/10/2018 1453   GFRNONAA 79 08/29/2018 1515   GFRAA 91 08/29/2018 1515   No results found for: CHOL, HDL, LDLCALC, LDLDIRECT, TRIG, CHOLHDL No results found for: YHAJ8R No results found for: VITAMINB12 No results found for: TSH  Lauraine Born, AGNP-C, DNP 05/07/2024, 3:26 PM [facility] [address] [place], [place] [address] [phone]

## 2024-05-07 ENCOUNTER — Telehealth: Payer: Self-pay | Admitting: Neurology

## 2024-05-07 ENCOUNTER — Ambulatory Visit: Admitting: Neurology

## 2024-05-07 ENCOUNTER — Encounter: Payer: Self-pay | Admitting: Neurology

## 2024-05-07 VITALS — BP 132/70 | HR 66 | Ht 68.0 in | Wt 175.0 lb

## 2024-05-07 DIAGNOSIS — G3184 Mild cognitive impairment, so stated: Secondary | ICD-10-CM

## 2024-05-07 DIAGNOSIS — F411 Generalized anxiety disorder: Secondary | ICD-10-CM | POA: Diagnosis not present

## 2024-05-07 NOTE — Patient Instructions (Addendum)
 Great to see you today! Stop Aricept  for 1 to 2 weeks to see if improvement in muscle cramps Referral for neuropsychological evaluation Recommend exercise, brain stimulating activity, healthy eating, drinking plenty of water, management of vascular risk factors to promote brain health  Follow-up in 6 months Dr. Chalice.  Thanks!!

## 2024-05-07 NOTE — Telephone Encounter (Signed)
 Referral for neuropsychology fax to [facility]. Phone: [phone], Fax; [phone].

## 2024-05-10 ENCOUNTER — Other Ambulatory Visit: Payer: Self-pay | Admitting: Neurology

## 2024-05-12 NOTE — Progress Notes (Signed)
 " Electrophysiology Office Note:   Date:  05/13/2024  ID:  Marc, Benson 11/05/1950, MRN [id number]  Primary Cardiologist: None Electrophysiologist: Fonda Kitty, MD      History of Present Illness:   Marc Benson is a 73 y.o. male with h/o AF s/p PVI x2 in 2007, PAC's / PVC's, HTN, anxiety who is being seen today for AF management.   Discussed the use of AI scribe software for clinical note transcription with the patient, who gave verbal consent to proceed.  History of Present Illness Marc Benson is a 73 year old male with atrial fibrillation who presents with recurrent episodes of atrial fibrillation.  He has a history of atrial fibrillation and underwent two ablations in 2007, one in March and another in November. After the first ablation, he experienced atrial fibrillation the next day. Since the second ablation, he has had episodes of atrial fibrillation four to five times a year, lasting four to five minutes each, until recently when he experienced a two to three-hour episode, the longest in years.  He was previously on blood thinners until 2014 or 2015 and has been off them since. He describes his heart rate jumping several times a month, sometimes reaching as high as 210-215 bpm, with a recent episode reaching 185 bpm. He manages these episodes with a pill taken after 20 minutes, and if needed, another after an hour. He has only needed the second pill twice in the last four to five years.  He reports feeling weak and drained during episodes and has been taken home from the golf [place] due to this weakness. He has never passed out but feels weak during episodes.  He has a history of taking amiodarone, which was discontinued in favor of ablation.     Review of systems complete and found to be negative unless listed in HPI.   EP Information / Studies Reviewed:    EKG is not ordered today. EKG from 05/02/24 reviewed which showed sinus bradycardia.       Echo 07/2018:  -  Left ventricle: The cavity size was normal. Systolic function was    normal. The estimated ejection fraction was in the range of 50%    to 55%. Wall motion was normal; there were no regional wall    motion abnormalities. Left ventricular diastolic function    parameters were normal.  - Aortic valve: Trileaflet; severely thickened, severely calcified    leaflets.  - Tricuspid valve: There was trivial regurgitation.  - Pulmonic valve: There was trivial regurgitation.   Coronary CTA 09/2018:  IMPRESSION: 1. Coronary calcium  score of 313. This was 68th percentile for age and sex matched control.   2. Normal coronary origin with right dominance.   3. Milld, non-obstructive disease in all three coronaries and the left main.   4. Recommend aggressive risk factor modification including high potency statin.  Risk Assessment/Calculations:    CHA2DS2-VASc Score = 1   This indicates a 0.6% annual risk of stroke. The patient's score is based upon: CHF History: 0 HTN History: 0 Diabetes History: 0 Stroke History: 0 Vascular Disease History: 0 Age Score: 1 Gender Score: 0              Physical Exam:   VS:  BP 137/72   Pulse 70   Ht 5' 8 (1.727 m)   Wt 175 lb (79.4 kg)   SpO2 97%   BMI 26.61 kg/m    Wt Readings from Last 3 Encounters:  05/13/24 175 lb (79.4 kg)  05/07/24 175 lb (79.4 kg)  05/02/24 174 lb (78.9 kg)     GEN: Well nourished, well developed in no acute distress NECK: No JVD CARDIAC: Normal rate, regular rhythm.  RESPIRATORY:  Clear to auscultation without rales, wheezing or rhonchi  ABDOMEN: Soft, non-distended EXTREMITIES:  No edema; No deformity   ASSESSMENT AND PLAN:    Paroxysmal Atrial Fibrillation: Symptomatic. S/p remote ablation x 2. Had AF next day after first ablation in 2007. Burden improved after 2nd ablation. Continues to have paroxysms, reviewed Apple Watch tracings.  Hypercoagulable State due to AF:  -Discussed treatment options today for  AF including antiarrhythmic drug therapy and ablation. Discussed risks, recovery and likelihood of success with each treatment strategy. Risk, benefits, and alternatives to EP study and ablation for afib were discussed. These risks include but are not limited to stroke, bleeding, vascular damage, tamponade, perforation, damage to the esophagus, lungs, phrenic nerve and other structures, pulmonary vein stenosis, worsening renal function, coronary vasospasm and death.  Discussed potential need for repeat ablation procedures and antiarrhythmic drugs after an initial ablation. The patient understands these risk and wishes to proceed.  We will therefore proceed with catheter ablation at the next available time.  Carto, ICE, anesthesia are requested for the procedure.   We will obtain CT PV protocol prior to the procedure. We will also obtain an updated echocardiogram.  -Plan for full EP study at time of ablation due to episodes of possible SVT. -Start diltiazem  120mg  daily. Additional 30mg  prn.  -Start Eliquis  in anticipation of ablation.    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  -Atorvastatin  40mg  daily per Cardiology  -Stop aspirin while taking Eliquis .    Follow up with Dr. Kennyth 3 months after ablation.   Signed, Fonda Kennyth, MD  "

## 2024-05-13 ENCOUNTER — Ambulatory Visit: Attending: Cardiology | Admitting: Cardiology

## 2024-05-13 ENCOUNTER — Encounter: Payer: Self-pay | Admitting: Cardiology

## 2024-05-13 VITALS — BP 137/72 | HR 70 | Ht 68.0 in | Wt 175.0 lb

## 2024-05-13 DIAGNOSIS — I480 Paroxysmal atrial fibrillation: Secondary | ICD-10-CM | POA: Diagnosis not present

## 2024-05-13 DIAGNOSIS — D6869 Other thrombophilia: Secondary | ICD-10-CM | POA: Diagnosis not present

## 2024-05-13 DIAGNOSIS — I2510 Atherosclerotic heart disease of native coronary artery without angina pectoris: Secondary | ICD-10-CM | POA: Diagnosis not present

## 2024-05-13 MED ORDER — DILTIAZEM HCL ER COATED BEADS 120 MG PO CP24: 120.0000 mg | ORAL_CAPSULE | Freq: Every day | ORAL | 3 refills | Status: DC

## 2024-05-13 MED ORDER — APIXABAN 5 MG PO TABS: 5.0000 mg | ORAL_TABLET | Freq: Two times a day (BID) | ORAL | 3 refills | Status: DC

## 2024-05-13 NOTE — Patient Instructions (Addendum)
 Medication Instructions:  Your physician has recommended you make the following change in your medication:  1) START taking Eliquis  5 mg twice daily  2) START taking Cardizem  (diltiazem ) 120 mg once daily  3) STOP taking aspirin  *If you need a refill on your cardiac medications before your next appointment, please call your pharmacy*  Testing/Procedures: Echocardiogram  Your physician has requested that you have an echocardiogram. Echocardiography is a painless test that uses sound waves to create images of your heart. It provides your doctor with information about the size and shape of your heart and how well your heart's chambers and valves are working. This procedure takes approximately one hour. There are no restrictions for this procedure. Please do NOT wear cologne, perfume, aftershave, or lotions (deodorant is allowed). Please arrive 15 minutes prior to your appointment time.  Please note: We ask at that you not bring children with you during ultrasound (echo/ vascular) testing. Due to room size and safety concerns, children are not allowed in the ultrasound rooms during exams. Our front office staff cannot provide observation of children in our lobby area while testing is being conducted. An adult accompanying a patient to their appointment will only be allowed in the ultrasound room at the discretion of the ultrasound technician under special circumstances. We apologize for any inconvenience.  Ablation Your physician has recommended that you have an ablation. Catheter ablation is a medical procedure used to treat some cardiac arrhythmias (irregular heartbeats). During catheter ablation, a long, thin, flexible tube is put into a blood vessel in your groin (upper thigh), or neck. This tube is called an ablation catheter. It is then guided to your heart through the blood vessel. Radio frequency waves destroy small areas of heart tissue where abnormal heartbeats may cause an arrhythmia to  start.   You are scheduled for Atrial Fibrillation Ablation on Thursday, November 21st with Dr. Sidra Kitty.Please arrive at the Main Entrance A at [facility]: [address] [place], [place] [address] at 10:30am.  What To Expect:  Labs: you will need to have lab work drawn within 30 days of your procedure. Please go to any [facility] location to have these drawn - no appointment is needed. Cardiac CT Scan: this will be done about 3-4 weeks prior to your procedure. You will be contacted to schedule this test. You will receive procedure instructions either through MyChart or in the mail 4-6 week prior to your procedure.  After your procedure we recommend no driving for 3 days, no lifting over 10 lbs for 5 days, and no work or strenuous activity for 7 days.  Please contact our office at [phone] if you have any questions.    Follow-Up: We will contact you to schedule your post-procedure appointments.

## 2024-05-14 ENCOUNTER — Other Ambulatory Visit: Payer: Self-pay

## 2024-05-14 DIAGNOSIS — I480 Paroxysmal atrial fibrillation: Secondary | ICD-10-CM

## 2024-05-14 NOTE — Addendum Note (Signed)
 Addended by: CHAUVIGNE, Vail Vuncannon on: 05/14/2024 08:16 AM   Modules accepted: Orders

## 2024-05-15 NOTE — Progress Notes (Signed)
 Stable cognitive status, can follow up in 12 months. I am not sure what mechanism would cause his hands to draw up when taking Aricept . He should stay on oral magnesium as this may help.  It will not interfere with a fib control on a 400 mg daily dose.   I am glad he is doing so well. Dedra Gores, MD

## 2024-05-19 DIAGNOSIS — K080 Exfoliation of teeth due to systemic causes: Secondary | ICD-10-CM | POA: Diagnosis not present

## 2024-05-20 ENCOUNTER — Encounter: Payer: Self-pay | Admitting: Neurology

## 2024-05-23 ENCOUNTER — Ambulatory Visit (HOSPITAL_COMMUNITY)
Admission: RE | Admit: 2024-05-23 | Discharge: 2024-05-23 | Disposition: A | Discharge status: Home / Self Care | Source: Ambulatory Visit | Attending: Cardiology | Admitting: Cardiology

## 2024-05-23 DIAGNOSIS — I480 Paroxysmal atrial fibrillation: Secondary | ICD-10-CM | POA: Insufficient documentation

## 2024-05-23 DIAGNOSIS — D6869 Other thrombophilia: Secondary | ICD-10-CM | POA: Insufficient documentation

## 2024-05-23 DIAGNOSIS — I2510 Atherosclerotic heart disease of native coronary artery without angina pectoris: Secondary | ICD-10-CM | POA: Diagnosis not present

## 2024-05-23 LAB — ECHOCARDIOGRAM COMPLETE
Area-P 1/2: 2.96 cm2
S' Lateral: 3.9 cm

## 2024-06-03 DIAGNOSIS — K080 Exfoliation of teeth due to systemic causes: Secondary | ICD-10-CM | POA: Diagnosis not present

## 2024-06-05 DIAGNOSIS — R413 Other amnesia: Secondary | ICD-10-CM | POA: Diagnosis not present

## 2024-06-08 ENCOUNTER — Ambulatory Visit: Payer: Self-pay | Admitting: Cardiology

## 2024-06-25 DIAGNOSIS — R413 Other amnesia: Secondary | ICD-10-CM | POA: Diagnosis not present

## 2024-06-30 ENCOUNTER — Telehealth: Payer: Self-pay | Admitting: Cardiology

## 2024-06-30 MED ORDER — APIXABAN 5 MG PO TABS: 5.0000 mg | ORAL_TABLET | Freq: Two times a day (BID) | ORAL | 3 refills | Status: DC

## 2024-06-30 NOTE — Telephone Encounter (Signed)
 Pt is requesting a callback regarding him having some questions and concerns before upcoming procedure that he'd like to discuss with nurse. Pt stated if no answer, please leave a message. Please advise.

## 2024-06-30 NOTE — Telephone Encounter (Signed)
 Spoke with the patient who states that he is supposed to start on a blood thinner prior to his ablation but he has not received it yet. Advised patient that this was sent in to [facility] mail service on 9/2 when he saw Dr. Kennyth. Patient states that he never got it. Advised patient that I will send in a new prescription for him to his local [facility]. He will need to start taking it on 10/23, 4 weeks prior to his ablation. Patient also inquired about his upcoming CT scan next week. Advised patient on instructions for his CT. I will also send these through MyChart.

## 2024-07-01 DIAGNOSIS — F411 Generalized anxiety disorder: Secondary | ICD-10-CM | POA: Diagnosis not present

## 2024-07-01 DIAGNOSIS — F5101 Primary insomnia: Secondary | ICD-10-CM | POA: Diagnosis not present

## 2024-07-03 ENCOUNTER — Telehealth: Payer: Self-pay

## 2024-07-03 NOTE — Telephone Encounter (Signed)
-----   Message from Nurse Carlyle C sent at 05/14/2024  8:16 AM EDT ----- Regarding: FW: 11/20 afib ablation  ----- Message ----- From: Magdaline Salles, RN Sent: 05/14/2024   8:13 AM EDT To: [facility] Pre Cert/Auth; Cvd-Ep Sched# Subject: 11/20 afib ablation                            Precert:  MD: Kennyth Type of ablation: A-fib Diagnosis: A-fib CPT code: A-fib (06343) Ablation scheduled (date/time): 11/20 at 12:30pm  Procedure:  Added to calendar? Yes Orders entered? Yes Letter complete? No, >30 days before procedure Scheduled with cath lab? Yes Any medications to hold? No Labs ordered (CBC, BMET, PT/INR if on warfarin): Yes Mapping system: CARTO (lab 4 or 6) CARTO/OPAL rep notified? No Cardiac CT needed? Yes, ordered Dye allergy? No Pre-meds ordered and instructions given? No, >30 days before procedure Letter method: MyChart H&P: 9/2 Device: No  Follow-up:  Cassie/Angel, please schedule Routine.  Covering RN - please send this message to [employer], EP scheduler, EP Scheduling pool, EP [employer], and CT scheduler ([place]/Stephanie Mogg), if indicated.

## 2024-07-08 DIAGNOSIS — H52223 Regular astigmatism, bilateral: Secondary | ICD-10-CM | POA: Diagnosis not present

## 2024-07-10 ENCOUNTER — Ambulatory Visit (HOSPITAL_COMMUNITY)
Admission: RE | Admit: 2024-07-10 | Discharge: 2024-07-10 | Disposition: A | Discharge status: Home / Self Care | Source: Ambulatory Visit | Attending: Cardiology | Admitting: Cardiology

## 2024-07-10 DIAGNOSIS — I480 Paroxysmal atrial fibrillation: Secondary | ICD-10-CM | POA: Insufficient documentation

## 2024-07-10 DIAGNOSIS — I700 Atherosclerosis of aorta: Secondary | ICD-10-CM | POA: Diagnosis not present

## 2024-07-10 MED ORDER — IOHEXOL 350 MG/ML SOLN
80.0000 mL | Freq: Once | INTRAVENOUS | Status: AC | PRN
Start: 2024-07-10 — End: 2024-07-10
  Administered 2024-07-10: 80 mL via INTRAVENOUS

## 2024-07-11 ENCOUNTER — Encounter (HOSPITAL_COMMUNITY): Payer: Self-pay

## 2024-07-11 ENCOUNTER — Telehealth (HOSPITAL_COMMUNITY): Payer: Self-pay

## 2024-07-11 DIAGNOSIS — I480 Paroxysmal atrial fibrillation: Secondary | ICD-10-CM | POA: Diagnosis not present

## 2024-07-11 NOTE — Telephone Encounter (Signed)
 Spoke with patient to complete pre-procedure call.     Health status review:  Any new medical conditions, recent signs of acute illness or been started on antibiotics? No Any recent hospitalizations or surgeries? No Any new medications started since pre-op visit? No  Follow all medication instructions prior to procedure or the procedure may be rescheduled:    Continue taking Eliquis  (Apixaban ) twice daily without missing any doses before procedure. Essential chronic medications:  No medication should be continued, unless told otherwise. On the morning of your procedure DO NOT take any medication., including Eliquis  (Apixaban ).  Nothing to eat or drink after midnight prior to your procedure.  Pre-procedure testing scheduled: CT completed and lab work by November 6.  Confirmed patient is scheduled for Atrial Fibrillation Ablation on Thursday, November 20 with Dr. Dr. Kennyth. Instructed patient to arrive at the Main Entrance A at [facility]: [address] [place], [place] [address] and check in at Admitting at 9:30 AM.  Plan to go home the same day, you will only stay overnight if medically necessary.. You MUST have a responsible adult to drive you home and MUST be with you the first 24 hours after you arrive home or your procedure could be cancelled.  Informed a nurse may call a day before the procedure to confirm arrival time and ensure instructions are followed.  Patient verbalized understanding to information provided and is agreeable to proceed with procedure.   Advised to contact RN Navigator at [phone], to inform of any new medications started after call or concerns prior to procedure.

## 2024-07-12 LAB — BASIC METABOLIC PANEL WITH GFR
BUN/Creatinine Ratio: 16 (ref 10–24)
BUN: 15 mg/dL (ref 8–27)
CO2: 23 mmol/L (ref 20–29)
Calcium: 8.7 mg/dL (ref 8.6–10.2)
Chloride: 102 mmol/L (ref 96–106)
Creatinine, Ser: 0.95 mg/dL (ref 0.76–1.27)
Glucose: 105 mg/dL — ABNORMAL HIGH (ref 70–99)
Potassium: 3.8 mmol/L (ref 3.5–5.2)
Sodium: 139 mmol/L (ref 134–144)
eGFR: 84 mL/min/1.73 (ref >59)

## 2024-07-12 LAB — CBC
Hematocrit: 34.2 % — ABNORMAL LOW (ref 37.5–51.0)
Hemoglobin: 11.9 g/dL — ABNORMAL LOW (ref 13.0–17.7)
MCH: 35.7 pg — ABNORMAL HIGH (ref 26.6–33.0)
MCHC: 34.8 g/dL (ref 31.5–35.7)
MCV: 103 fL — ABNORMAL HIGH (ref 79–97)
Platelets: 166 x10E3/uL (ref 150–450)
RBC: 3.33 x10E6/uL — ABNORMAL LOW (ref 4.14–5.80)
RDW: 12.1 % (ref 11.6–15.4)
WBC: 5.4 x10E3/uL (ref 3.4–10.8)

## 2024-07-15 DIAGNOSIS — F411 Generalized anxiety disorder: Secondary | ICD-10-CM | POA: Diagnosis not present

## 2024-07-15 DIAGNOSIS — F5101 Primary insomnia: Secondary | ICD-10-CM | POA: Diagnosis not present

## 2024-07-24 ENCOUNTER — Ambulatory Visit (INDEPENDENT_AMBULATORY_CARE_PROVIDER_SITE_OTHER): Admitting: Family Medicine

## 2024-07-24 ENCOUNTER — Encounter: Payer: Self-pay | Admitting: Family Medicine

## 2024-07-24 VITALS — BP 116/42 | HR 49 | Temp 97.9°F | Ht 69.0 in | Wt 178.6 lb

## 2024-07-24 DIAGNOSIS — D539 Nutritional anemia, unspecified: Secondary | ICD-10-CM

## 2024-07-24 DIAGNOSIS — F4323 Adjustment disorder with mixed anxiety and depressed mood: Secondary | ICD-10-CM

## 2024-07-24 DIAGNOSIS — E7800 Pure hypercholesterolemia, unspecified: Secondary | ICD-10-CM

## 2024-07-24 DIAGNOSIS — G3184 Mild cognitive impairment, so stated: Secondary | ICD-10-CM | POA: Diagnosis not present

## 2024-07-24 DIAGNOSIS — Z0000 Encounter for general adult medical examination without abnormal findings: Secondary | ICD-10-CM | POA: Diagnosis not present

## 2024-07-24 DIAGNOSIS — R252 Cramp and spasm: Secondary | ICD-10-CM

## 2024-07-24 DIAGNOSIS — E039 Hypothyroidism, unspecified: Secondary | ICD-10-CM | POA: Diagnosis not present

## 2024-07-24 LAB — LIPID PANEL
Cholesterol: 146 mg/dL (ref 0–200)
HDL: 85.1 mg/dL (ref >39.00)
LDL Cholesterol: 46 mg/dL (ref 0–99)
NonHDL: 60.67
Total CHOL/HDL Ratio: 2
Triglycerides: 73 mg/dL (ref 0.0–149.0)
VLDL: 14.6 mg/dL (ref 0.0–40.0)

## 2024-07-24 LAB — HEPATIC FUNCTION PANEL
ALT: 17 U/L (ref 0–53)
AST: 21 U/L (ref 0–37)
Albumin: 4.4 g/dL (ref 3.5–5.2)
Alkaline Phosphatase: 44 U/L (ref 39–117)
Bilirubin, Direct: 0.2 mg/dL (ref 0.0–0.3)
Total Bilirubin: 1.1 mg/dL (ref 0.2–1.2)
Total Protein: 6.4 g/dL (ref 6.0–8.3)

## 2024-07-24 LAB — CBC WITH DIFFERENTIAL/PLATELET
Basophils Absolute: 0 K/uL (ref 0.0–0.1)
Basophils Relative: 0.6 % (ref 0.0–3.0)
Eosinophils Absolute: 0.1 K/uL (ref 0.0–0.7)
Eosinophils Relative: 1.9 % (ref 0.0–5.0)
HCT: 37.1 % — ABNORMAL LOW (ref 39.0–52.0)
Hemoglobin: 12.9 g/dL — ABNORMAL LOW (ref 13.0–17.0)
Lymphocytes Relative: 35.6 % (ref 12.0–46.0)
Lymphs Abs: 1.4 K/uL (ref 0.7–4.0)
MCHC: 34.7 g/dL (ref 30.0–36.0)
MCV: 102.8 fl — ABNORMAL HIGH (ref 78.0–100.0)
Monocytes Absolute: 0.4 K/uL (ref 0.1–1.0)
Monocytes Relative: 10.2 % (ref 3.0–12.0)
Neutro Abs: 2.1 K/uL (ref 1.4–7.7)
Neutrophils Relative %: 51.7 % (ref 43.0–77.0)
Platelets: 165 K/uL (ref 150.0–400.0)
RBC: 3.6 Mil/uL — ABNORMAL LOW (ref 4.22–5.81)
RDW: 13.7 % (ref 11.5–15.5)
WBC: 4.1 K/uL (ref 4.0–10.5)

## 2024-07-24 LAB — FOLATE: Folate: 10.6 ng/mL (ref >5.9)

## 2024-07-24 LAB — VITAMIN B12: Vitamin B-12: 255 pg/mL (ref 211–911)

## 2024-07-24 LAB — TSH: TSH: 2.91 u[IU]/mL (ref 0.35–5.50)

## 2024-07-24 MED ORDER — ATORVASTATIN CALCIUM 40 MG PO TABS: 40.0000 mg | ORAL_TABLET | Freq: Every day | ORAL | 3 refills | Status: DC

## 2024-07-24 MED ORDER — ALFUZOSIN HCL ER 10 MG PO TB24: 10.0000 mg | ORAL_TABLET | Freq: Every day | ORAL | 3 refills | Status: DC

## 2024-07-24 MED ORDER — MAGNESIUM 400 MG PO TABS: 1.0000 | ORAL_TABLET | Freq: Every day | ORAL | 3 refills | Status: DC

## 2024-07-24 MED ORDER — APIXABAN 5 MG PO TABS: 5.0000 mg | ORAL_TABLET | Freq: Two times a day (BID) | ORAL | 3 refills | Status: DC

## 2024-07-24 MED ORDER — OMEPRAZOLE 40 MG PO CPDR: 40.0000 mg | DELAYED_RELEASE_CAPSULE | Freq: Every day | ORAL | 3 refills | Status: DC

## 2024-07-24 MED ORDER — DILTIAZEM HCL ER COATED BEADS 120 MG PO CP24: 120.0000 mg | ORAL_CAPSULE | Freq: Every day | ORAL | 3 refills | Status: DC

## 2024-07-24 MED ORDER — DONEPEZIL HCL 5 MG PO TABS: 5.0000 mg | ORAL_TABLET | Freq: Every day | ORAL | 2 refills | Status: DC

## 2024-07-24 MED ORDER — IPRATROPIUM BROMIDE 0.03 % NA SOLN: 1.0000 | Freq: Two times a day (BID) | NASAL | 5 refills | Status: DC | PRN

## 2024-07-24 MED ORDER — DILTIAZEM HCL 30 MG PO TABS: 30.0000 mg | ORAL_TABLET | ORAL | 0 refills | Status: DC | PRN

## 2024-07-24 MED ORDER — LEVOTHYROXINE SODIUM 50 MCG PO TABS: 50.0000 ug | ORAL_TABLET | Freq: Every day | ORAL | 3 refills | Status: DC

## 2024-07-24 MED ORDER — TIZANIDINE HCL 4 MG PO TABS: 4.0000 mg | ORAL_TABLET | Freq: Every day | ORAL | 1 refills | Status: DC

## 2024-07-24 NOTE — Progress Notes (Signed)
 Office Note 07/24/2024  CC:  Chief Complaint  Patient presents with   Establish Care    Pt has many concerns: brain fog, cramps at night since April, anxiety; discuss recent labs (CBC) for upcoming ablation on 11/20 (Dr.Parker); nasal drainage    HPI:  Marc Benson is a 73 y.o. male who is here to establish care, CPE, discuss some problems. Patient's most recent primary MD: [facility] Old records in epic/health Link or reviewed prior to or during today's visit.  Marc Benson describes quite a bit of problems last year or so. He is frustrated with impersonal medical care and wants to just establish a stable provider-patient relationship. Lots of stress.  He describes some distressing conflict with some people that) at home from him.  After dealing with it for a long time, the excessive frustration leaked over into a verbal confrontation.  He has some regret over this. Several physical problems have come up.  After having no A-fib for a long time he had a recurrence this summer and there is plan for a repeat ablation and he was put back on anticoagulant. About 6 to 8 months ago he started having multiple abdominal/gastrointestinal symptoms.  He describes seeing a specialist and having an extensive workup but nothing was found to explain his symptoms.  Not long after the workup he says his symptoms spontaneously resolved. He has muscle cramps, chronic but worse lately.  Tizanidine seems to help a little bit, at least from a sedation/sleep standpoint if not muscle cramps standpoint. He has developed some erectile dysfunction problems.  His libido is strong.  Towards the end of 2024 he started to notice more more problems with finding the right words to finish his sentences/thoughts. He saw Dr. Chalice in neurology and states that there was no clear reason found for his symptoms.  He was placed on Aricept .  He is not sure if it is helping at all.  He does not think he is  having any side effects from it, though.  He describes a long history of mild to moderate level of chronic anxiety.  He was on nortriptyline and Xanax for approximately 25 years.  He prefers to be off of the medication, does not even think it was helping.  He is on atorvastatin  but would like to see if getting off of it helps his tendency to get the muscle cramps.   Last CPE 07/05/2023.  Past Medical History:  Diagnosis Date   ANXIETY 05/14/2009   Aortic atherosclerosis    Bilateral chronic knee pain    L lateral meniscus tear on MRI 03/2024, +tricomp arth   BPH with lower urinary tract symptoms without urinary obstruction    CAD (coronary artery disease)    Nonobtructive.  elev cor ca score.   Diverticulosis    GERD (gastroesophageal reflux disease)    Hepatic steatosis    ultrasound 2025   Hypercholesterolemia    Hypothyroidism    Insomnia    Mild cognitive impairment    Chronic lacunar infarct in the right corona radiata.  Moderate degree of chronic small vessel ischemic changes   Osteoarthritis, multiple sites    PAROXYSMAL ATRIAL FIBRILLATION 05/14/2009   s/p PVI x 2 [facility]   PREMATURE VENTRICULAR CONTRACTIONS 05/14/2009    Past Surgical History:  Procedure Laterality Date   ABLATION     CARDIAC CATHETERIZATION  11/18/2004   COLONOSCOPY  02/2024   2019 and 2025, recall 10 yr   head surgery  Left 2015   BCC removed from Lt side of head- Dermatologist Dr Bard Molt   INGUINAL HERNIA REPAIR Right 2002   KNEE ARTHROSCOPY Left    ROTATOR CUFF REPAIR Left 09/30/2014   thumb extraction Left 05/19/2016   left thumb cut off accident   TRANSTHORACIC ECHOCARDIOGRAM     02/2024 normal    Family History  Problem Relation Age of Onset   Aneurysm Mother    Heart attack Father    Diabetes Father     Social History   Socioeconomic History   Marital status: Married    Spouse name: Not on file   Number of children: Not on file   Years of education: Not on file    Highest education level: Not on file  Occupational History   Not on file  Tobacco Use   Smoking status: Former    Current packs/day: 0.00    Types: Cigarettes    Quit date: 1976    Years since quitting: 49.9   Smokeless tobacco: Never  Vaping Use   Vaping status: Never Used  Substance and Sexual Activity   Alcohol use: No   Drug use: No   Sexual activity: Yes    Partners: Female    Comment: married  Other Topics Concern   Not on file  Social History Narrative   Married, has lived in [place] for 20 years.   Retired advertising account planner.   Never smoker.  No alcohol.   Social Drivers of Corporate Investment Banker Strain: Not on file  Food Insecurity: Not on file  Transportation Needs: Not on file  Physical Activity: Not on file  Stress: Not on file  Social Connections: Unknown (01/20/2022)   Received from [facility]   Social Network    Social Network: Not on file  Intimate Partner Violence: Unknown (12/12/2021)   Received from [facility]   HITS    Physically Hurt: Not on file    Insult or Talk Down To: Not on file    Threaten Physical Harm: Not on file    Scream or Curse: Not on file    Outpatient Encounter Medications as of 07/24/2024  Medication Sig   [DISCONTINUED] alfuzosin (UROXATRAL) 10 MG 24 hr tablet Take 10 mg by mouth daily.   [DISCONTINUED] apixaban  (ELIQUIS ) 5 MG TABS tablet Take 1 tablet (5 mg total) by mouth 2 (two) times daily.   [DISCONTINUED] atorvastatin  (LIPITOR) 40 MG tablet Take 1 tablet (40 mg total) by mouth daily.   [DISCONTINUED] diltiazem  (CARDIZEM  CD) 120 MG 24 hr capsule Take 1 capsule (120 mg total) by mouth daily.   [DISCONTINUED] diltiazem  (CARDIZEM ) 30 MG tablet Take 1 tablet (30 mg total) by mouth as needed for up to 1 dose (persistent palpitations).   [DISCONTINUED] donepezil  (ARICEPT ) 5 MG tablet TAKE 1 TABLET BY MOUTH AT BEDTIME   [DISCONTINUED] ipratropium (ATROVENT ) 0.03 % nasal spray Place 1-2 sprays into both nostrils 2 (two)  times daily as needed (nasal drainage).   [DISCONTINUED] levothyroxine (SYNTHROID, LEVOTHROID) 50 MCG tablet Take 50 mcg by mouth daily.    [DISCONTINUED] Magnesium 400 MG TABS Take 1 tablet by mouth daily.   [DISCONTINUED] omeprazole (PRILOSEC) 40 MG capsule 1 capsule 1/2 to 1 hour before morning meal Orally Once a day for 30 days   [DISCONTINUED] tiZANidine (ZANAFLEX) 4 MG tablet 1 tablet at bedtime as needed Orally Once a day for 30 days   alfuzosin (UROXATRAL) 10 MG 24 hr tablet Take 1 tablet (  10 mg total) by mouth daily.   apixaban  (ELIQUIS ) 5 MG TABS tablet Take 1 tablet (5 mg total) by mouth 2 (two) times daily.   atorvastatin  (LIPITOR) 40 MG tablet Take 1 tablet (40 mg total) by mouth daily.   diltiazem  (CARDIZEM  CD) 120 MG 24 hr capsule Take 1 capsule (120 mg total) by mouth daily.   diltiazem  (CARDIZEM ) 30 MG tablet Take 1 tablet (30 mg total) by mouth as needed for up to 1 dose (persistent palpitations).   donepezil  (ARICEPT ) 5 MG tablet Take 1 tablet (5 mg total) by mouth at bedtime.   ipratropium (ATROVENT ) 0.03 % nasal spray Place 1-2 sprays into both nostrils 2 (two) times daily as needed (nasal drainage).   levothyroxine (SYNTHROID) 50 MCG tablet Take 1 tablet (50 mcg total) by mouth daily.   Magnesium 400 MG TABS Take 1 tablet by mouth daily.   omeprazole (PRILOSEC) 40 MG capsule Take 1 capsule (40 mg total) by mouth daily.   tiZANidine (ZANAFLEX) 4 MG tablet Take 1 tablet (4 mg total) by mouth at bedtime.   No facility-administered encounter medications on file as of 07/24/2024.    Allergies  Allergen Reactions   Latex Itching and Rash    LATEX TAPE   Benadryl [Diphenhydramine Hcl]    Tape     Other Reaction(s): Not available    Review of Systems  Constitutional:  Negative for appetite change, chills, fatigue and fever.  HENT:  Negative for congestion, dental problem, ear pain and sore throat.   Eyes:  Negative for discharge, redness and visual disturbance.   Respiratory:  Negative for cough, chest tightness, shortness of breath and wheezing.   Cardiovascular:  Negative for chest pain, palpitations and leg swelling.  Gastrointestinal:  Negative for abdominal pain, blood in stool, diarrhea, nausea and vomiting.  Genitourinary:  Negative for difficulty urinating, dysuria, flank pain, frequency, hematuria and urgency.  Musculoskeletal:  Negative for arthralgias, back pain, joint swelling, myalgias and neck stiffness.  Skin:  Negative for pallor and rash.  Neurological:  Negative for dizziness, speech difficulty, weakness and headaches.  Hematological:  Negative for adenopathy. Does not bruise/bleed easily.  Psychiatric/Behavioral:  Positive for sleep disturbance. Negative for confusion and suicidal ideas. The patient is nervous/anxious.     PE; Blood pressure (!) 116/42, pulse (!) 49, temperature 97.9 F (36.6 C), temperature source Oral, height 5' 9 (1.753 m), weight 178 lb 9.6 oz (81 kg), SpO2 97%.  Physical Exam Body mass index is 26.37 kg/m.  Gen: Alert, well appearing.  Patient is oriented to person, place, time, and situation. AFFECT: pleasant, lucid thought and speech. ENT: Ears: EACs clear, normal epithelium.  TMs with good light reflex and landmarks bilaterally.  Eyes: no injection, icteris, swelling, or exudate.  EOMI, PERRLA. Nose: no drainage or turbinate edema/swelling.  No injection or focal lesion.  Mouth: lips without lesion/swelling.  Oral mucosa pink and moist.  Dentition intact and without obvious caries or gingival swelling.  Oropharynx without erythema, exudate, or swelling.  Neck: supple/nontender.  No LAD, mass, or TM.  Carotid pulses 2+ bilaterally, without bruits. CV: RRR, no m/r/g.   LUNGS: CTA bilat, nonlabored resps, good aeration in all lung fields. ABD: soft, NT, ND, BS normal.  No hepatospenomegaly or mass.  No bruits. EXT: no clubbing, cyanosis, or edema.  Musculoskeletal: no joint swelling, erythema, warmth, or  tenderness.  ROM of all joints intact. Skin - no sores or suspicious lesions or rashes or color changes  Pertinent labs:  Last CBC Lab Results  Component Value Date   WBC 4.1 07/24/2024   HGB 12.9 (L) 07/24/2024   HCT 37.1 (L) 07/24/2024   MCV 102.8 (H) 07/24/2024   MCH 35.7 (H) 07/11/2024   RDW 13.7 07/24/2024   PLT 165.0 07/24/2024   Lab Results  Component Value Date   ESRSEDRATE 2 12/27/2023   Last metabolic panel Lab Results  Component Value Date   GLUCOSE 105 (H) 07/11/2024   NA 139 07/11/2024   K 3.8 07/11/2024   CL 102 07/11/2024   CO2 23 07/11/2024   BUN 15 07/11/2024   CREATININE 0.95 07/11/2024   EGFR 84 07/11/2024   CALCIUM  8.7 07/11/2024   PROT 6.4 07/24/2024   ALBUMIN 4.4 07/24/2024   LABGLOB 2.3 12/27/2023   AGRATIO 1.8 (H) 12/27/2023   BILITOT 1.1 07/24/2024   ALKPHOS 44 07/24/2024   AST 21 07/24/2024   ALT 17 07/24/2024   ANIONGAP 10 02/14/2017   Last lipids Lab Results  Component Value Date   CHOL 146 07/24/2024   HDL 85.10 07/24/2024   LDLCALC 46 07/24/2024   TRIG 73.0 07/24/2024   CHOLHDL 2 07/24/2024   Last hemoglobin A1c Lab Results  Component Value Date   HGBA1C 5.4 07/05/2023   HGBA1C 5.4 07/05/2023   Last thyroid  functions Lab Results  Component Value Date   TSH 2.91 07/24/2024   Lab Results  Component Value Date   ANA Negative 12/27/2023   Last vitamin B12 and Folate Lab Results  Component Value Date   VITAMINB12 255 07/24/2024   FOLATE 10.6 07/24/2024   ASSESSMENT AND PLAN:   New patient establishing care.  #1 health maintenance exam: Reviewed age and gender appropriate health maintenance issues (prudent diet, regular exercise, health risks of tobacco and excessive alcohol, use of seatbelts, fire alarms in home, use of sunscreen).  Also reviewed age and gender appropriate health screening as well as vaccine recommendations. Vaccines: Shingrix-->deferred.  Prevnar 20-->deferred.  Flu-->deferred. Labs: Lipid panel,  hepatic panel, TSH ( Prostate ca screening: inadvertently forgot to discuss with pt today.  Will inquire at next f/u about his wishes. Colon ca screening: Last colonoscopy was 2025-->no polyps.    #2 adjustment disorder with mixed anxious and depressed mood.  This is superimposed on what sounds like generalized anxiety disorder for years. I do not think he is clinically depressed. Will consider starting SSRI when I see him back in a month after he gets his ablation.  3.  Hypercholesterolemia.  His lipids have been at goal (he has aortic atherosclerosis) on atorvastatin  20 mg a day.  Checking lipid panel today. He has leg cramps and wonders if this could be statin related.  He would like to discontinue his statin for now and recheck cholesterol in 6 months.  #4 macrocytic anemia. Mild. Hemoglobin was 12.9 on 07/05/2023 and MCV was 96. On 07/11/2024 his hemoglobin was 11.9 and MCV was 103.  White blood cell and platelets have been normal. Check vitamin B12, folate, and iron panel today.  #5 mild cognitive impairment. I suspect this is due to his worsened anxiety over the last year. He will continue Aricept  and follow-up with Dr. Chalice as planned.  An After Visit Summary was printed and given to the patient.  Return in about 4 weeks (around 08/21/2024) for routine chronic illness f/u.  Signed:  Gerlene Hockey, MD           07/24/2024

## 2024-07-25 ENCOUNTER — Ambulatory Visit: Payer: Self-pay | Admitting: Family Medicine

## 2024-07-25 LAB — IRON,TIBC AND FERRITIN PANEL
%SAT: 42 % (ref 20–48)
Ferritin: 68 ng/mL (ref 24–380)
Iron: 152 ug/dL (ref 50–180)
TIBC: 362 ug/dL (ref 250–425)

## 2024-07-28 ENCOUNTER — Telehealth: Payer: Self-pay | Admitting: Cardiology

## 2024-07-28 DIAGNOSIS — M5412 Radiculopathy, cervical region: Secondary | ICD-10-CM | POA: Diagnosis not present

## 2024-07-28 DIAGNOSIS — M898X1 Other specified disorders of bone, shoulder: Secondary | ICD-10-CM | POA: Diagnosis not present

## 2024-07-28 NOTE — Telephone Encounter (Signed)
 Pt would like to know if he should take his blood thinner today or not. He is having an ablation on Thursday and is not sure if he should hold medication. Pt also received medications from his Ortho and want to know if he should wait to start. Please advise.

## 2024-07-28 NOTE — Telephone Encounter (Signed)
 Spoke with the patient and advised for him to hold off on taking anymore prednisone until after his ablation.  He states that he took 60 mg this morning, but will not take anymore.

## 2024-07-28 NOTE — Telephone Encounter (Signed)
 Spoke with pt regarding his questions. Pt was told to continue taking Eliquis  without missing any doses but to hold it along with all other medications the day of the procedure. Pt verbalized understanding. All questions if any were answered. Pt stated he has started taking prednisone this morning as well as methotarbanol for pain he is having in his back and shoulder. Pt wanted to make sure this did not interfere with his procedure.

## 2024-07-29 ENCOUNTER — Other Ambulatory Visit: Payer: Self-pay

## 2024-07-29 DIAGNOSIS — F5101 Primary insomnia: Secondary | ICD-10-CM | POA: Diagnosis not present

## 2024-07-29 DIAGNOSIS — F411 Generalized anxiety disorder: Secondary | ICD-10-CM | POA: Diagnosis not present

## 2024-07-29 MED ORDER — LEVOTHYROXINE SODIUM 50 MCG PO TABS: 50.0000 ug | ORAL_TABLET | Freq: Every day | ORAL | 3 refills | Status: DC

## 2024-07-29 MED ORDER — MAGNESIUM 400 MG PO TABS: 1.0000 | ORAL_TABLET | Freq: Every day | ORAL | 3 refills | Status: AC

## 2024-07-29 MED ORDER — TIZANIDINE HCL 4 MG PO TABS: 4.0000 mg | ORAL_TABLET | Freq: Every day | ORAL | 1 refills | Status: DC

## 2024-07-29 MED ORDER — DILTIAZEM HCL ER COATED BEADS 120 MG PO CP24: 120.0000 mg | ORAL_CAPSULE | Freq: Every day | ORAL | 3 refills | Status: AC

## 2024-07-29 MED ORDER — DONEPEZIL HCL 5 MG PO TABS: 5.0000 mg | ORAL_TABLET | Freq: Every day | ORAL | 2 refills | Status: AC

## 2024-07-29 MED ORDER — ALFUZOSIN HCL ER 10 MG PO TB24: 10.0000 mg | ORAL_TABLET | Freq: Every day | ORAL | 3 refills | Status: AC

## 2024-07-29 MED ORDER — IPRATROPIUM BROMIDE 0.03 % NA SOLN: 1.0000 | Freq: Two times a day (BID) | NASAL | 5 refills | Status: AC | PRN

## 2024-07-29 MED ORDER — ATORVASTATIN CALCIUM 40 MG PO TABS: 40.0000 mg | ORAL_TABLET | Freq: Every day | ORAL | 3 refills | Status: DC

## 2024-07-29 MED ORDER — APIXABAN 5 MG PO TABS: 5.0000 mg | ORAL_TABLET | Freq: Two times a day (BID) | ORAL | 3 refills | Status: DC

## 2024-07-30 NOTE — Pre-Procedure Instructions (Signed)
 Instructed patient on the following items: Arrival time 0930 Nothing to eat or drink after midnight No meds AM of procedure Responsible person to drive you home and stay with you for 24 hrs  Have you missed any doses of anti-coagulant Eliquis - takes twice a day, missed a dose last week.  Don't take dose morning of procedure.  Notified Dr Kennyth will obtain EKG on arrival tomorrow.

## 2024-07-31 ENCOUNTER — Ambulatory Visit (HOSPITAL_COMMUNITY)
Admission: RE | Admit: 2024-07-31 | Discharge: 2024-07-31 | Disposition: A | Attending: Cardiology | Admitting: Cardiology

## 2024-07-31 ENCOUNTER — Encounter (HOSPITAL_COMMUNITY)
Admission: RE | Disposition: A | Discharge status: Home / Self Care | Payer: Self-pay | Source: Home / Self Care | Attending: Cardiology

## 2024-07-31 ENCOUNTER — Ambulatory Visit (HOSPITAL_COMMUNITY): Admitting: Certified Registered Nurse Anesthetist

## 2024-07-31 ENCOUNTER — Other Ambulatory Visit: Payer: Self-pay

## 2024-07-31 ENCOUNTER — Encounter: Payer: Self-pay | Admitting: Family Medicine

## 2024-07-31 DIAGNOSIS — Z87891 Personal history of nicotine dependence: Secondary | ICD-10-CM | POA: Diagnosis not present

## 2024-07-31 DIAGNOSIS — I2510 Atherosclerotic heart disease of native coronary artery without angina pectoris: Secondary | ICD-10-CM | POA: Diagnosis not present

## 2024-07-31 DIAGNOSIS — E039 Hypothyroidism, unspecified: Secondary | ICD-10-CM | POA: Insufficient documentation

## 2024-07-31 DIAGNOSIS — I10 Essential (primary) hypertension: Secondary | ICD-10-CM | POA: Diagnosis not present

## 2024-07-31 DIAGNOSIS — K219 Gastro-esophageal reflux disease without esophagitis: Secondary | ICD-10-CM | POA: Diagnosis not present

## 2024-07-31 DIAGNOSIS — I480 Paroxysmal atrial fibrillation: Secondary | ICD-10-CM

## 2024-07-31 DIAGNOSIS — I4891 Unspecified atrial fibrillation: Secondary | ICD-10-CM

## 2024-07-31 DIAGNOSIS — D6869 Other thrombophilia: Secondary | ICD-10-CM | POA: Diagnosis not present

## 2024-07-31 HISTORY — PX: ATRIAL FIBRILLATION ABLATION: EP1191

## 2024-07-31 LAB — POCT ACTIVATED CLOTTING TIME: Activated Clotting Time: 216 s

## 2024-07-31 SURGERY — ATRIAL FIBRILLATION ABLATION
Anesthesia: General

## 2024-07-31 MED ORDER — FENTANYL CITRATE (PF) 250 MCG/5ML IJ SOLN
INTRAMUSCULAR | Status: DC | PRN
  Administered 2024-07-31: 50 ug via INTRAVENOUS

## 2024-07-31 MED ORDER — PROPOFOL 10 MG/ML IV BOLUS
INTRAVENOUS | Status: DC | PRN
Start: 2024-07-31 — End: 2024-07-31
  Administered 2024-07-31: 140 mg via INTRAVENOUS

## 2024-07-31 MED ORDER — FENTANYL CITRATE (PF) 100 MCG/2ML IJ SOLN
INTRAMUSCULAR | Status: AC
  Filled 2024-07-31: qty 2

## 2024-07-31 MED ORDER — ONDANSETRON HCL 4 MG/2ML IJ SOLN
INTRAMUSCULAR | Status: DC | PRN
  Administered 2024-07-31: 4 mg via INTRAVENOUS

## 2024-07-31 MED ORDER — SODIUM CHLORIDE 0.9 % IV SOLN: 250.0000 mL | INTRAVENOUS | Status: DC | PRN

## 2024-07-31 MED ORDER — PROTAMINE SULFATE 10 MG/ML IV SOLN
INTRAVENOUS | Status: DC | PRN
  Administered 2024-07-31: 35 mg via INTRAVENOUS

## 2024-07-31 MED ORDER — ACETAMINOPHEN 325 MG PO TABS: 650.0000 mg | ORAL_TABLET | ORAL | Status: DC | PRN

## 2024-07-31 MED ORDER — HEPARIN SODIUM (PORCINE) 1000 UNIT/ML IJ SOLN
INTRAMUSCULAR | Status: DC | PRN
  Administered 2024-07-31: 14000 [IU] via INTRAVENOUS
  Administered 2024-07-31: 5000 [IU] via INTRAVENOUS

## 2024-07-31 MED ORDER — SUGAMMADEX SODIUM 200 MG/2ML IV SOLN
INTRAVENOUS | Status: DC | PRN
  Administered 2024-07-31: 200 mg via INTRAVENOUS

## 2024-07-31 MED ORDER — SODIUM CHLORIDE 0.9% FLUSH: 3.0000 mL | INTRAVENOUS | Status: DC | PRN

## 2024-07-31 MED ORDER — HEPARIN (PORCINE) IN NACL 1000-0.9 UT/500ML-% IV SOLN
INTRAVENOUS | Status: DC | PRN
Start: 2024-07-31 — End: 2024-07-31
  Administered 2024-07-31 (×3): 500 mL

## 2024-07-31 MED ORDER — SODIUM CHLORIDE 0.9% FLUSH: 3.0000 mL | Freq: Two times a day (BID) | INTRAVENOUS | Status: DC

## 2024-07-31 MED ORDER — LIDOCAINE 2% (20 MG/ML) 5 ML SYRINGE
INTRAMUSCULAR | Status: DC | PRN
  Administered 2024-07-31: 20 mg via INTRAVENOUS

## 2024-07-31 MED ORDER — PHENYLEPHRINE HCL-NACL 20-0.9 MG/250ML-% IV SOLN
INTRAVENOUS | Status: DC | PRN
  Administered 2024-07-31: 20 ug/min via INTRAVENOUS

## 2024-07-31 MED ORDER — ATROPINE SULFATE 1 MG/ML IV SOLN
INTRAVENOUS | Status: DC | PRN
Start: 2024-07-31 — End: 2024-07-31
  Administered 2024-07-31: 1 mg via INTRAVENOUS

## 2024-07-31 MED ORDER — DEXAMETHASONE SOD PHOSPHATE PF 10 MG/ML IJ SOLN
INTRAMUSCULAR | Status: DC | PRN
  Administered 2024-07-31: 10 mg via INTRAVENOUS

## 2024-07-31 MED ORDER — ROCURONIUM BROMIDE 10 MG/ML (PF) SYRINGE
PREFILLED_SYRINGE | INTRAVENOUS | Status: DC | PRN
Start: 2024-07-31 — End: 2024-07-31
  Administered 2024-07-31: 50 mg via INTRAVENOUS
  Administered 2024-07-31: 20 mg via INTRAVENOUS

## 2024-07-31 MED ORDER — SODIUM CHLORIDE 0.9 % IV SOLN: INTRAVENOUS | Status: DC

## 2024-07-31 MED ORDER — ONDANSETRON HCL 4 MG/2ML IJ SOLN
4.0000 mg | Freq: Four times a day (QID) | INTRAMUSCULAR | Status: DC | PRN
Start: 2024-07-31 — End: 2024-08-01

## 2024-07-31 MED ORDER — APIXABAN 5 MG PO TABS
5.0000 mg | ORAL_TABLET | Freq: Once | ORAL | Status: AC
  Administered 2024-07-31: 5 mg via ORAL
  Filled 2024-07-31: qty 1

## 2024-07-31 SURGICAL SUPPLY — 18 items
BLANKET WARM UNDERBOD FULL ACC (MISCELLANEOUS) ×1 IMPLANT
CABLE FARASTAR GEN2 SNGL USE (CABLE) IMPLANT
CATH BI DIR 7FR CS F-J 12 PIN (CATHETERS) IMPLANT
CATH FARAWAVE 2.0 31 (CATHETERS) IMPLANT
CATH GE 8FR SOUNDSTAR (CATHETERS) IMPLANT
CATH OCTARAY 2.0 F 3-3-3-3-3 (CATHETERS) IMPLANT
CLOSURE PERCLOSE PROSTYLE (Vascular Products) IMPLANT
COVER SWIFTLINK CONNECTOR (BAG) ×1 IMPLANT
DILATOR VESSEL 38 20CM 16FR (INTRODUCER) IMPLANT
GUIDEWIRE INQWIRE 1.5J.035X260 (WIRE) IMPLANT
KIT VERSACROSS CNCT FARADRIVE (KITS) IMPLANT
PACK EP LF (CUSTOM PROCEDURE TRAY) ×1 IMPLANT
PAD DEFIB RADIO PHYSIO CONN (PAD) ×1 IMPLANT
PATCH CARTO3 (PAD) IMPLANT
SHEATH FARADRIVE STEERABLE (SHEATH) IMPLANT
SHEATH PINNACLE 8F 10CM (SHEATH) IMPLANT
SHEATH PINNACLE 9F 10CM (SHEATH) IMPLANT
SHEATH PROBE COVER 6X72 (BAG) IMPLANT

## 2024-07-31 NOTE — Progress Notes (Signed)
  Pt arrived from EP via Bed to HA 20. Report received from RN and CRNA. Pt vitals are stable, performed q49min see vitals flowsheet. Anesthesia recovery has been uneventful. Pt to transition to short stay for remainder of recovery. Will continue to monitor patient while under holding area care.

## 2024-07-31 NOTE — Progress Notes (Signed)
 Patient ambulated in the hallway. Patient voided via urinal before getting up to ambulate. Bilateral groin sites clean, dry, and intact. No bleeding or hematoma noted. Patient received ordered medications and ok to discharge home per the provider.

## 2024-07-31 NOTE — Anesthesia Procedure Notes (Addendum)
 Procedure Name: Intubation Date/Time: 07/31/2024 12:57 PM  Performed by: Lyle Delon LABOR, CRNAPre-anesthesia Checklist: Patient identified, Emergency Drugs available, Suction available and Patient being monitored Patient Re-evaluated:Patient Re-evaluated prior to induction Oxygen Delivery Method: Circle system utilized Preoxygenation: Pre-oxygenation with 100% oxygen Induction Type: IV induction Ventilation: Mask ventilation without difficulty Laryngoscope Size: Mac and 4 Grade View: Grade II Tube type: Oral Tube size: 7.5 mm Number of attempts: 1 Airway Equipment and Method: Stylet Placement Confirmation: ETT inserted through vocal cords under direct vision, positive ETCO2 and breath sounds checked- equal and bilateral Secured at: 23 cm Tube secured with: Tape Dental Injury: Teeth and Oropharynx as per pre-operative assessment

## 2024-07-31 NOTE — H&P (Signed)
 Electrophysiology Note:   Date:  07/31/24  ID:  Artavious, Trebilcock 11/08/1950, MRN [id number]   Primary Cardiologist: None Electrophysiologist: Fonda Kitty, MD       History of Present Illness:   FIELD STANISZEWSKI is a 73 y.o. male with h/o AF s/p PVI x2 in 2007, PAC's / PVC's, HTN, anxiety who is being seen today for AF management.     Discussed the use of AI scribe software for clinical note transcription with the patient, who gave verbal consent to proceed.   History of Present Illness BAXTER GONZALEZ is a 74 year old male with atrial fibrillation who presents with recurrent episodes of atrial fibrillation.   He has a history of atrial fibrillation and underwent two ablations in 2007, one in March and another in November. After the first ablation, he experienced atrial fibrillation the next day. Since the second ablation, he has had episodes of atrial fibrillation four to five times a year, lasting four to five minutes each, until recently when he experienced a two to three-hour episode, the longest in years.   He was previously on blood thinners until 2014 or 2015 and has been off them since. He describes his heart rate jumping several times a month, sometimes reaching as high as 210-215 bpm, with a recent episode reaching 185 bpm. He manages these episodes with a pill taken after 20 minutes, and if needed, another after an hour. He has only needed the second pill twice in the last four to five years.   He reports feeling weak and drained during episodes and [place] this weakness. He has never passed out but feels weak during episodes.   He has a history of taking amiodarone, which was discontinued in favor of ablation.       Interval: Patient presents today for planned ablation. Reports feeling relatively well aside from some musculoskeletal back pain. No new or acute complaints.  Review of systems complete and found to be negative unless  listed in HPI.    EP Information / Studies Reviewed:     EKG is not ordered today. EKG from 05/02/24 reviewed which showed sinus bradycardia.         Echo 07/2018:  - Left ventricle: The cavity size was normal. Systolic function was    normal. The estimated ejection fraction was in the range of 50%    to 55%. Wall motion was normal; there were no regional wall    motion abnormalities. Left ventricular diastolic function    parameters were normal.  - Aortic valve: Trileaflet; severely thickened, severely calcified    leaflets.  - Tricuspid valve: There was trivial regurgitation.  - Pulmonic valve: There was trivial regurgitation.    Coronary CTA 09/2018:  IMPRESSION: 1. Coronary calcium  score of 313. This was 68th percentile for age and sex matched control.   2. Normal coronary origin with right dominance.   3. Milld, non-obstructive disease in all three coronaries and the left main.   4. Recommend aggressive risk factor modification including high potency statin.   Risk Assessment/Calculations:     CHA2DS2-VASc Score = 1   This indicates a 0.6% annual risk of stroke. The patient's score is based upon: CHF History: 0 HTN History: 0 Diabetes History: 0 Stroke History: 0 Vascular Disease History: 0 Age Score: 1 Gender Score: 0             Physical Exam:  Today's Vitals   07/31/24 1048  BP: 131/69  Pulse: (!) 53  Resp: 16  SpO2: 98%  Weight: 79.4 kg  Height: 5' 9 (1.753 m)  PainSc: 8    Body mass index is 25.84 kg/m.   GEN: Well nourished, well developed in no acute distress NECK: No JVD CARDIAC: Normal rate, regular rhythm.  RESPIRATORY:  Clear to auscultation without rales, wheezing or rhonchi  ABDOMEN: Soft, non-distended EXTREMITIES:  No edema; No deformity    ASSESSMENT AND PLAN:     Paroxysmal Atrial Fibrillation: Symptomatic. S/p remote ablation x 2. Had AF next day after first ablation in 2007. Burden improved after 2nd ablation. Continues  to have paroxysms, reviewed Apple Watch tracings.  Hypercoagulable State due to AF:  -Discussed treatment options today for AF including antiarrhythmic drug therapy and ablation. Discussed risks, recovery and likelihood of success with each treatment strategy. Risk, benefits, and alternatives to EP study and ablation for afib were discussed. These risks include but are not limited to stroke, bleeding, vascular damage, tamponade, perforation, damage to the esophagus, lungs, phrenic nerve and other structures, pulmonary vein stenosis, worsening renal function, coronary vasospasm and death.  Discussed potential need for repeat ablation procedures and antiarrhythmic drugs after an initial ablation. The patient understands these risk and wishes to proceed today. -Plan for full EP study at time of ablation due to episodes of possible SVT. -Start diltiazem  120mg  daily. Additional 30mg  prn.  -Start Eliquis  in anticipation of ablation.    Follow up with Dr. Kennyth 3 months after ablation.    Signed, Fonda Kennyth, MD

## 2024-07-31 NOTE — Anesthesia Preprocedure Evaluation (Signed)
 Anesthesia Evaluation  Patient identified by MRN, date of birth, ID band Patient awake    Reviewed: Allergy & Precautions, NPO status , Patient's Chart, lab work & pertinent test results  Airway Mallampati: II  TM Distance: >3 FB Neck ROM: Full    Dental  (+) Dental Advisory Given   Pulmonary former smoker   breath sounds clear to auscultation       Cardiovascular hypertension, Pt. on medications and Pt. on home beta blockers + CAD  + dysrhythmias Atrial Fibrillation  Rhythm:Regular Rate:Normal     Neuro/Psych negative neurological ROS     GI/Hepatic Neg liver ROS,GERD  ,,  Endo/Other  Hypothyroidism    Renal/GU negative Renal ROS     Musculoskeletal  (+) Arthritis ,    Abdominal   Peds  Hematology negative hematology ROS (+)   Anesthesia Other Findings   Reproductive/Obstetrics                              Anesthesia Physical Anesthesia Plan  ASA: 2  Anesthesia Plan: General   Post-op Pain Management: Tylenol PO (pre-op)* and Minimal or no pain anticipated   Induction: Intravenous  PONV Risk Score and Plan: 2 and Ondansetron , Dexamethasone and Treatment may vary due to age or medical condition  Airway Management Planned: Oral ETT  Additional Equipment: None  Intra-op Plan:   Post-operative Plan: Extubation in OR  Informed Consent: I have reviewed the patients History and Physical, chart, labs and discussed the procedure including the risks, benefits and alternatives for the proposed anesthesia with the patient or authorized representative who has indicated his/her understanding and acceptance.     Dental advisory given  Plan Discussed with: CRNA  Anesthesia Plan Comments:         Anesthesia Quick Evaluation

## 2024-07-31 NOTE — Discharge Instructions (Signed)

## 2024-07-31 NOTE — Transfer of Care (Signed)
 Immediate Anesthesia Transfer of Care Note  Patient: Marc Benson  Procedure(s) Performed: ATRIAL FIBRILLATION ABLATION  Patient Location: PACU and Cath Lab  Anesthesia Type:General  Level of Consciousness: awake and alert   Airway & Oxygen Therapy: Patient Spontanous Breathing and Patient connected to nasal cannula oxygen  Post-op Assessment: Report given to RN and Post -op Vital signs reviewed and stable  Post vital signs: Reviewed and stable  Last Vitals:  Vitals Value Taken Time  BP    Temp    Pulse 64 07/31/24 14:29  Resp 16 07/31/24 14:29  SpO2 96 % 07/31/24 14:29  Vitals shown include unfiled device data.  Last Pain:  Vitals:   07/31/24 1048  PainSc: 8          Complications: There were no known notable events for this encounter.

## 2024-08-01 ENCOUNTER — Emergency Department (HOSPITAL_BASED_OUTPATIENT_CLINIC_OR_DEPARTMENT_OTHER)
Admission: EM | Admit: 2024-08-01 | Discharge: 2024-08-01 | Disposition: A | Discharge status: Home / Self Care | Attending: Emergency Medicine | Admitting: Emergency Medicine

## 2024-08-01 ENCOUNTER — Telehealth (HOSPITAL_COMMUNITY): Payer: Self-pay

## 2024-08-01 ENCOUNTER — Encounter (HOSPITAL_BASED_OUTPATIENT_CLINIC_OR_DEPARTMENT_OTHER): Payer: Self-pay

## 2024-08-01 ENCOUNTER — Telehealth: Payer: Self-pay

## 2024-08-01 ENCOUNTER — Other Ambulatory Visit: Payer: Self-pay

## 2024-08-01 DIAGNOSIS — Z79890 Hormone replacement therapy (postmenopausal): Secondary | ICD-10-CM | POA: Insufficient documentation

## 2024-08-01 DIAGNOSIS — I4891 Unspecified atrial fibrillation: Secondary | ICD-10-CM | POA: Diagnosis not present

## 2024-08-01 DIAGNOSIS — E039 Hypothyroidism, unspecified: Secondary | ICD-10-CM | POA: Insufficient documentation

## 2024-08-01 DIAGNOSIS — I10 Essential (primary) hypertension: Secondary | ICD-10-CM | POA: Insufficient documentation

## 2024-08-01 DIAGNOSIS — I2510 Atherosclerotic heart disease of native coronary artery without angina pectoris: Secondary | ICD-10-CM | POA: Diagnosis not present

## 2024-08-01 DIAGNOSIS — M546 Pain in thoracic spine: Secondary | ICD-10-CM | POA: Insufficient documentation

## 2024-08-01 DIAGNOSIS — Z87891 Personal history of nicotine dependence: Secondary | ICD-10-CM | POA: Insufficient documentation

## 2024-08-01 DIAGNOSIS — Z7901 Long term (current) use of anticoagulants: Secondary | ICD-10-CM | POA: Diagnosis not present

## 2024-08-01 DIAGNOSIS — M62830 Muscle spasm of back: Secondary | ICD-10-CM | POA: Diagnosis not present

## 2024-08-01 DIAGNOSIS — M549 Dorsalgia, unspecified: Secondary | ICD-10-CM | POA: Diagnosis not present

## 2024-08-01 DIAGNOSIS — M62838 Other muscle spasm: Secondary | ICD-10-CM

## 2024-08-01 MED ORDER — ACETAMINOPHEN 500 MG PO TABS
1000.0000 mg | ORAL_TABLET | Freq: Once | ORAL | Status: AC
  Administered 2024-08-01: 1000 mg via ORAL
  Filled 2024-08-01: qty 2

## 2024-08-01 MED ORDER — DIAZEPAM 5 MG PO TABS
5.0000 mg | ORAL_TABLET | Freq: Once | ORAL | Status: AC
  Administered 2024-08-01: 5 mg via ORAL
  Filled 2024-08-01: qty 1

## 2024-08-01 MED ORDER — DIAZEPAM 5 MG PO TABS: 5.0000 mg | ORAL_TABLET | Freq: Two times a day (BID) | ORAL | 0 refills | Status: DC | PRN

## 2024-08-01 MED ORDER — LIDOCAINE 5 % EX PTCH
1.0000 | MEDICATED_PATCH | Freq: Once | CUTANEOUS | Status: DC
  Administered 2024-08-01: 1 via TRANSDERMAL
  Filled 2024-08-01: qty 1

## 2024-08-01 NOTE — Discharge Instructions (Addendum)
 It was a pleasure caring for you today in the emergency department.  Please call your PCP to arrange follow-up in the next week  The medication that I prescribed you, Valium , can make you sleepy.  Please not drive or operate machinery while taking this medication.  You can also purchase lidocaine  patches over the counter to help with your pain  Please return to the emergency department for any worsening or worrisome symptoms.

## 2024-08-01 NOTE — ED Provider Notes (Signed)
 [place] [facility] Provider Note  CSN: [id number] Arrival date & time: 08/01/24 1108  Chief Complaint(s) groin wound  HPI ADHRIT KRENZ is a 73 y.o. male with past medical history as below, significant for CAD, GERD, HLD, paroxysmal atrial fibrillation, anticoagulated who presents to the ED with complaint of back pain, wound check  Patient reports he had an ablation yesterday for his A-fib, reports the procedure was uneventful, is not having chest pain or palpitation difficulty breathing since then.  He reports that the bandage in his right groin is coming loose and he had a little bit of fluid draining out, clear.  Has not subsided.  He is not having any pain to his groin, no pain going down his legs, no difficulty with ambulation.  No sensation changes distal to the cath sites.  Patient also reports prior to his ablation he was painting for prolonged period time, following the painting he developed some thoracic back pain, pain in his right elbow.  He was seen by orthopedics, he was started on steroids initially but was advised discontinue the steroids by his cardiologist at the time of the ablation.  Denies any significant trauma to the affected area, no numbness or weakness of extremities, no fevers, denies history of spinal injections or procedures to the effected area   Past Medical History Past Medical History:  Diagnosis Date   ANXIETY 05/14/2009   Aortic atherosclerosis    Bilateral chronic knee pain    L lateral meniscus tear on MRI 03/2024, +tricomp arth   BPH with lower urinary tract symptoms without urinary obstruction    CAD (coronary artery disease)    Nonobtructive.  elev cor ca score.   Diverticulosis    GERD (gastroesophageal reflux disease)    Hepatic steatosis    ultrasound 2025   Hypercholesterolemia    Hypothyroidism    Insomnia    Mild cognitive impairment    Chronic lacunar infarct in the right corona radiata.  Moderate  degree of chronic small vessel ischemic changes   Osteoarthritis, multiple sites    PAROXYSMAL ATRIAL FIBRILLATION 05/14/2009   s/p PVI x 2 [facility]   PREMATURE VENTRICULAR CONTRACTIONS 05/14/2009   Patient Active Problem List   Diagnosis Date Noted   Mild cognitive impairment of uncertain or unknown etiology 12/27/2023   GAD (generalized anxiety disorder) 12/27/2023   PAC (premature atrial contraction) 10/08/2019   Anxiety state 05/14/2009   Essential hypertension 05/14/2009   PAROXYSMAL ATRIAL FIBRILLATION 05/14/2009   PVC's (premature ventricular contractions) 05/14/2009   SLEEP DISORDER, HX OF 05/14/2009   Home Medication(s) Prior to Admission medications   Medication Sig Start Date End Date Taking? Authorizing Provider  diazepam  (VALIUM ) 5 MG tablet Take 1 tablet (5 mg total) by mouth every 12 (twelve) hours as needed for muscle spasms. 08/01/24  Yes Elnor Savant A, DO  alfuzosin  (UROXATRAL ) 10 MG 24 hr tablet Take 1 tablet (10 mg total) by mouth daily. 07/29/24   McGowen, Aleene DEL, MD  apixaban  (ELIQUIS ) 5 MG TABS tablet Take 1 tablet (5 mg total) by mouth 2 (two) times daily. 07/29/24   McGowen, Aleene DEL, MD  atorvastatin  (LIPITOR) 40 MG tablet Take 1 tablet (40 mg total) by mouth daily. 07/29/24 10/27/24  McGowen, Philip H, MD  Cholecalciferol 50 MCG (2000 UT) TABS Take 1 tablet by mouth daily.    [provider]  diltiazem  (CARDIZEM  CD) 120 MG 24 hr capsule Take 1 capsule (120 mg total) by mouth  daily. 07/29/24   McGowen, Aleene DEL, MD  diltiazem  (CARDIZEM ) 30 MG tablet Take 1 tablet (30 mg total) by mouth as needed for up to 1 dose (persistent palpitations). Patient not taking: Reported on 07/25/2024 07/24/24   McGowen, Philip H, MD  donepezil  (ARICEPT ) 5 MG tablet Take 1 tablet (5 mg total) by mouth at bedtime. 07/29/24   McGowen, Aleene DEL, MD  ipratropium (ATROVENT ) 0.03 % nasal spray Place 1-2 sprays into both nostrils 2 (two) times daily as needed (nasal drainage).  07/29/24   McGowen, Aleene DEL, MD  levothyroxine  (SYNTHROID ) 50 MCG tablet Take 1 tablet (50 mcg total) by mouth daily. 07/29/24   McGowen, Aleene DEL, MD  loratadine (CLARITIN) 10 MG tablet Take 10 mg by mouth daily.    [provider]  Magnesium  400 MG TABS Take 1 tablet by mouth daily. 07/29/24   McGowen, Aleene DEL, MD  tiZANidine  (ZANAFLEX ) 4 MG tablet Take 1 tablet (4 mg total) by mouth at bedtime. 07/29/24   Candise Aleene DEL, MD                                                                                                                                    Past Surgical History Past Surgical History:  Procedure Laterality Date   ABLATION     a-fib-->multiple-->most recent 11/202/25   CARDIAC CATHETERIZATION  11/18/2004   COLONOSCOPY  02/2024   2019 and 2025, recall 10 yr   head surgery  Left 2015   BCC removed from Lt side of head- Dermatologist Dr Bard Molt   INGUINAL HERNIA REPAIR Right 2002   KNEE ARTHROSCOPY Left    ROTATOR CUFF REPAIR Left 09/30/2014   thumb extraction Left 05/19/2016   left thumb cut off accident   TRANSTHORACIC ECHOCARDIOGRAM     02/2024 normal   Family History Family History  Problem Relation Age of Onset   Aneurysm Mother    Heart attack Father    Diabetes Father     Social History Social History   Tobacco Use   Smoking status: Former    Current packs/day: 0.00    Types: Cigarettes    Quit date: 1976    Years since quitting: 49.9   Smokeless tobacco: Never  Vaping Use   Vaping status: Never Used  Substance Use Topics   Alcohol use: No   Drug use: No   Allergies Latex, Benadryl [diphenhydramine hcl], and Tape  Review of Systems A thorough review of systems was obtained and all systems are negative except as noted in the HPI and PMH.   Physical Exam Vital Signs  I have reviewed the triage vital signs BP 134/76   Pulse 64   Temp 97.9 F (36.6 C) (Oral)   Resp 15   Ht 5' 9 (1.753 m)   Wt 79.4 kg   SpO2 98%   BMI  25.84 kg/m  Physical Exam Vitals and nursing note  reviewed.  Constitutional:      General: He is not in acute distress.    Appearance: Normal appearance. He is well-developed. He is not ill-appearing.  HENT:     Head: Normocephalic and atraumatic.     Right Ear: External ear normal.     Left Ear: External ear normal.     Nose: Nose normal.     Mouth/Throat:     Mouth: Mucous membranes are moist.  Eyes:     General: No scleral icterus.       Right eye: No discharge.        Left eye: No discharge.  Cardiovascular:     Rate and Rhythm: Normal rate.  Pulmonary:     Effort: Pulmonary effort is normal. No respiratory distress.     Breath sounds: No stridor.  Abdominal:     General: Abdomen is flat. There is no distension.     Tenderness: There is no guarding.  Genitourinary:    Comments: Wounds to bilateral inguinal areas noted, postoperative.  Wounds appear to be healing appropriately.  No palpable aneurysm, no surrounding erythema or cellulitic changes, nontender on palpation. Musculoskeletal:        General: No deformity.     Cervical back: No rigidity.       Back:     Comments: Paraspinal muscle tenderness  No midline spinous process tenderness to palpation or percussion, no crepitus or step-off.   Mild TTP on palpation of lateral epicondyle RUE   Skin:    General: Skin is warm and dry.     Coloration: Skin is not cyanotic, jaundiced or pale.  Neurological:     Mental Status: He is alert.  Psychiatric:        Speech: Speech normal.        Behavior: Behavior normal. Behavior is cooperative.     ED Results and Treatments Labs (all labs ordered are listed, but only abnormal results are displayed) Labs Reviewed - No data to display                                                                                                                        Radiology No results found.  Pertinent labs & imaging results that were available during my care of the patient were  reviewed by me and considered in my medical decision making (see MDM for details).  Medications Ordered in ED Medications  lidocaine  (LIDODERM ) 5 % 1 patch (1 patch Transdermal Patch Applied 08/01/24 1252)  diazepam  (VALIUM ) tablet 5 mg (5 mg Oral Given 08/01/24 1251)  acetaminophen  (TYLENOL ) tablet 1,000 mg (1,000 mg Oral Given 08/01/24 1251)  Procedures Procedures  (including critical care time)  Medical Decision Making / ED Course    Medical Decision Making:    SIRRON FRANCESCONI is a 73 y.o. male  with past medical history as below, significant for CAD, GERD, HLD, paroxysmal atrial fibrillation, anticoagulated who presents to the ED with complaint of back pain, wound check. The complaint involves an extensive differential diagnosis and also carries with it a high risk of complications and morbidity.  Serious etiology was considered. Ddx includes but is not limited to: Differential diagnosis includes but is not exclusive to musculoskeletal back pain, renal colic, urinary tract infection, pyelonephritis, intra-abdominal causes of back pain, aortic aneurysm or dissection, cauda equina syndrome, sciatica, lumbar disc disease, thoracic disc disease, etc.   Complete initial physical exam performed, notably the patient was in nad.    Reviewed and confirmed nursing documentation for past medical history, family history, social history.  Vital signs reviewed.    Thoracic back pain> - Paraspinal muscles are TTP, recent overuse, also concern for lateral epicondylitis on right elbow. - Neurologically is intact, no falls or traumatic injuries - He would like to avoid narcotics - He is on apixaban  - Give patient Valium , Tylenol , lidocaine  patch - symptoms greatly improved   Wound/dressing check> - Patient reports bandage has slightly peeled back on his right groin  wound.  - Exam is reassuring, no pain on palpation, no ongoing bleeding or drainage.  No surrounding erythema or cellulitis - Patient is due to have the bandage removed this evening per instructions from his EP - wound appears to be healing appropriately   Clinical Course as of 08/01/24 1438  Fri Aug 01, 2024  1416 Symptoms greatly improved  [SG]    Clinical Course User Index [SG] Elnor Jayson LABOR, DO    Patient presents with back pain without signs of spinal cord compression, cauda equina syndrome, infection, aneurysm, or other serious etiology. The patient is neurologically intact. Given the extremely low risk of these diagnoses further testing and evaluation for these possibilities does not appear to be indicated at this time.  2:38 PM:  I have discussed the diagnosis/risks/treatment options with the patient and family.  Evaluation and diagnostic testing in the emergency department does not suggest an emergent condition requiring admission or immediate intervention beyond what has been performed at this time.  They will follow up with pcp. We also discussed returning to the ED immediately if new or worsening sx occur. We discussed the sx which are most concerning (e.g., sudden worsening pain, fever, inability to tolerate by mouth) that necessitate immediate return.    The patient appears reasonably screened and/or stabilized for discharge and I doubt any other medical condition or other [facility] requiring further screening, evaluation, or treatment in the ED at this time prior to discharge.                  Additional history obtained: -Additional history obtained from family -External records from outside source obtained and reviewed including: Chart review including previous notes, labs, imaging, consultation notes including  Pdmp Recent cardiology admission, ablation, Dr. Fonda Kitty   Lab Tests: na  EKG   EKG Interpretation Date/Time:  Friday August 01 2024  11:18:14 EST Ventricular Rate:  64 PR Interval:  171 QRS Duration:  110 QT Interval:  430 QTC Calculation: 444 R Axis:   76  Text Interpretation: Sinus rhythm Low voltage, extremity leads Probable anteroseptal infarct, old Baseline wander Interpretation limited secondary to artifact Confirmed by  Elnor Savant ([id number]) on 08/01/2024 12:42:58 PM         Imaging Studies ordered: na   Medicines ordered and prescription drug management: Meds ordered this encounter  Medications   diazepam  (VALIUM ) tablet 5 mg   lidocaine  (LIDODERM ) 5 % 1 patch   acetaminophen  (TYLENOL ) tablet 1,000 mg   diazepam  (VALIUM ) 5 MG tablet    Sig: Take 1 tablet (5 mg total) by mouth every 12 (twelve) hours as needed for muscle spasms.    Dispense:  10 tablet    Refill:  0    -I have reviewed the patients home medicines and have made adjustments as needed   Consultations Obtained: na   Cardiac Monitoring: The patient was maintained on a cardiac monitor.  I personally viewed and interpreted the cardiac monitored which showed an underlying rhythm of: nsr Continuous pulse oximetry interpreted by myself, 99% on RA.    Social Determinants of Health:  Diagnosis or treatment significantly limited by social determinants of health: former smoker   Reevaluation: After the interventions noted above, I reevaluated the patient and found that they have improved  Co morbidities that complicate the patient evaluation  Past Medical History:  Diagnosis Date   ANXIETY 05/14/2009   Aortic atherosclerosis    Bilateral chronic knee pain    L lateral meniscus tear on MRI 03/2024, +tricomp arth   BPH with lower urinary tract symptoms without urinary obstruction    CAD (coronary artery disease)    Nonobtructive.  elev cor ca score.   Diverticulosis    GERD (gastroesophageal reflux disease)    Hepatic steatosis    ultrasound 2025   Hypercholesterolemia    Hypothyroidism    Insomnia    Mild cognitive impairment     Chronic lacunar infarct in the right corona radiata.  Moderate degree of chronic small vessel ischemic changes   Osteoarthritis, multiple sites    PAROXYSMAL ATRIAL FIBRILLATION 05/14/2009   s/p PVI x 2 [facility]   PREMATURE VENTRICULAR CONTRACTIONS 05/14/2009      Dispostion: Disposition decision including need for hospitalization was considered, and patient discharged from emergency department.    Final Clinical Impression(s) / ED Diagnoses Final diagnoses:  Acute right-sided thoracic back pain  Muscle spasm        Elnor Savant LABOR, DO 08/01/24 1439

## 2024-08-01 NOTE — Anesthesia Postprocedure Evaluation (Signed)
 Anesthesia Post Note  Patient: Marc Benson  Procedure(s) Performed: ATRIAL FIBRILLATION ABLATION     Patient location during evaluation: PACU Anesthesia Type: General Level of consciousness: awake and alert Pain management: pain level controlled Vital Signs Assessment: post-procedure vital signs reviewed and stable Respiratory status: spontaneous breathing, nonlabored ventilation, respiratory function stable and patient connected to nasal cannula oxygen Cardiovascular status: blood pressure returned to baseline and stable Postop Assessment: no apparent nausea or vomiting Anesthetic complications: no   There were no known notable events for this encounter.  Last Vitals:  Vitals:   07/31/24 1700 07/31/24 1800  BP: 122/69 131/61  Pulse: (!) 53 (!) 54  Resp: 16 14  Temp:    SpO2: 93% 92%    Last Pain:  Vitals:   07/31/24 1517  TempSrc:   PainSc: 0-No pain                 Epifanio Lamar BRAVO

## 2024-08-01 NOTE — Telephone Encounter (Signed)
 Received vm from patient regarding bleeding from site of ablation. CM contacted patient back at [phone], patient was seen and discharged from the ED today.   Merilee Batty, MSN, RN Case Management [phone]

## 2024-08-01 NOTE — ED Triage Notes (Signed)
 Pt had a cardiac ablation yesterday and notes some drainage. States that he also has a pinched nerve in his neck that is also giving him some trouble. Denies chest pain. Some shortness of breath.

## 2024-08-01 NOTE — Telephone Encounter (Signed)
 Planned attempt to contact patient s/p AF ablation on 11/20 with Dr Kennyth. Noted patient is currently at [facility]- ED for concerns with procedure site. Made EP-APP, Renee, aware.

## 2024-08-02 ENCOUNTER — Encounter (HOSPITAL_COMMUNITY): Payer: Self-pay | Admitting: Cardiology

## 2024-08-04 NOTE — Telephone Encounter (Signed)
 Attempted to reach patient to follow up with procedure completed on 11/20, no answer. Left VM for patient to return call.

## 2024-08-11 DIAGNOSIS — H25011 Cortical age-related cataract, right eye: Secondary | ICD-10-CM | POA: Diagnosis not present

## 2024-08-11 DIAGNOSIS — H2511 Age-related nuclear cataract, right eye: Secondary | ICD-10-CM | POA: Diagnosis not present

## 2024-08-11 DIAGNOSIS — H04123 Dry eye syndrome of bilateral lacrimal glands: Secondary | ICD-10-CM | POA: Diagnosis not present

## 2024-08-11 DIAGNOSIS — H18513 Endothelial corneal dystrophy, bilateral: Secondary | ICD-10-CM | POA: Diagnosis not present

## 2024-08-11 DIAGNOSIS — H43811 Vitreous degeneration, right eye: Secondary | ICD-10-CM | POA: Diagnosis not present

## 2024-08-11 DIAGNOSIS — H2513 Age-related nuclear cataract, bilateral: Secondary | ICD-10-CM | POA: Diagnosis not present

## 2024-08-12 DIAGNOSIS — M549 Dorsalgia, unspecified: Secondary | ICD-10-CM | POA: Diagnosis not present

## 2024-08-12 DIAGNOSIS — M5412 Radiculopathy, cervical region: Secondary | ICD-10-CM | POA: Diagnosis not present

## 2024-08-12 DIAGNOSIS — M7918 Myalgia, other site: Secondary | ICD-10-CM | POA: Diagnosis not present

## 2024-08-12 DIAGNOSIS — M7711 Lateral epicondylitis, right elbow: Secondary | ICD-10-CM | POA: Diagnosis not present

## 2024-08-21 ENCOUNTER — Ambulatory Visit: Admitting: Family Medicine

## 2024-08-21 ENCOUNTER — Encounter: Payer: Self-pay | Admitting: Family Medicine

## 2024-08-21 VITALS — BP 132/75 | HR 53 | Temp 97.1°F | Wt 181.8 lb

## 2024-08-21 DIAGNOSIS — M7711 Lateral epicondylitis, right elbow: Secondary | ICD-10-CM

## 2024-08-21 DIAGNOSIS — J3089 Other allergic rhinitis: Secondary | ICD-10-CM

## 2024-08-21 DIAGNOSIS — Z23 Encounter for immunization: Secondary | ICD-10-CM

## 2024-08-21 DIAGNOSIS — E7800 Pure hypercholesterolemia, unspecified: Secondary | ICD-10-CM

## 2024-08-21 DIAGNOSIS — T50905A Adverse effect of unspecified drugs, medicaments and biological substances, initial encounter: Secondary | ICD-10-CM | POA: Diagnosis not present

## 2024-08-21 DIAGNOSIS — F411 Generalized anxiety disorder: Secondary | ICD-10-CM

## 2024-08-21 MED ORDER — NORTRIPTYLINE HCL 25 MG PO CAPS: 25.0000 mg | ORAL_CAPSULE | Freq: Every day | ORAL | 0 refills | Status: DC

## 2024-08-21 NOTE — Progress Notes (Signed)
 OFFICE VISIT  08/21/2024  CC:  Chief Complaint  Patient presents with   Medical Management of Chronic Issues    Patient is a 73 y.o. male who presents for 1 month follow-up statin induced myalgias and adjustment disorder. A/P as of last visit: 1 adjustment disorder with mixed anxious and depressed mood.  This is superimposed on what sounds like generalized anxiety disorder for years. I do not think he is clinically depressed. Will consider starting SSRI when I see him back in a month after he gets his ablation.   2.  Hypercholesterolemia.  His lipids have been at goal (he has aortic atherosclerosis) on atorvastatin  20 mg a day.  Checking lipid panel today. He has leg cramps and wonders if this could be statin related.  He would like to discontinue his statin for now and recheck cholesterol in 6 months.   #3 macrocytic anemia. Mild. Hemoglobin was 12.9 on 07/05/2023 and MCV was 96. On 07/11/2024 his hemoglobin was 11.9 and MCV was 103.  White blood cell and platelets have been normal. Check vitamin B12, folate, and iron panel today.   #4 mild cognitive impairment. I suspect this is due to his worsened anxiety over the last year. He will continue Aricept  and follow-up with Dr. Chalice as planned.  INTERIM HX: Labs all good last visit. His ablation procedure went well.  Marc Benson has chronic anxiety ever since he was a kid.  Lots of worries, poor concentration, irritability, poor sleep, energy problems.  Mood is not persistently depressed. He recalls some antidepressant trials in the past but only feels like these caused him sexual dysfunction. He is hesitant to take any medication like Xanax for anxiety. He was on nortriptyline for a couple of decades for this, unknown dose.  He got off of it a few years ago. He is willing to retry this medication.  He never stopped the statin, still with intermittent lower extremity cramps.  Couple of weeks ago he developed pain in the lateral  aspect of the right elbow after painting all day.  Symptoms wax and wane in severity.  He saw sports medicine MD, Dr. Delane. A tennis elbow strap was recommended.  Patient just now started this yesterday.  He also developed some pain in the area just medial to the medial border of the right scapula. Dr. Delane did a trigger point injection.  It has helped a little bit.  He has chronic allergic rhinitis.  Currently is taking Zyrtec daily. Not taking nasal steroid.  ROS as above, plus--> no fevers, no rashes, no melena/hematochezia.  No polyuria or polydipsia.   No focal weakness, paresthesias, or tremors.  No acute vision or hearing abnormalities.  No dysuria or unusual/new urinary urgency or frequency.  No recent changes in lower legs. No n/v/d or abd pain.  No palpitations.    Past Medical History:  Diagnosis Date   ANXIETY 05/14/2009   Aortic atherosclerosis    Bilateral chronic knee pain    L lateral meniscus tear on MRI 03/2024, +tricomp arth   BPH with lower urinary tract symptoms without urinary obstruction    CAD (coronary artery disease)    Nonobtructive.  elev cor ca score.   Diverticulosis    GERD (gastroesophageal reflux disease)    Hepatic steatosis    ultrasound 2025   Hypercholesterolemia    Hypothyroidism    Insomnia    Mild cognitive impairment    Chronic lacunar infarct in the right corona radiata.  Moderate degree of chronic  small vessel ischemic changes   Osteoarthritis, multiple sites    PAROXYSMAL ATRIAL FIBRILLATION 05/14/2009   s/p PVI x 2 [facility]   PREMATURE VENTRICULAR CONTRACTIONS 05/14/2009    Past Surgical History:  Procedure Laterality Date   ABLATION     a-fib-->multiple-->most recent 11/202/25   ATRIAL FIBRILLATION ABLATION N/A 07/31/2024   Procedure: ATRIAL FIBRILLATION ABLATION;  Surgeon: Kennyth Chew, MD;  Location: [facility] INVASIVE CV LAB;  Service: Cardiovascular;  Laterality: N/A;   CARDIAC CATHETERIZATION  11/18/2004   COLONOSCOPY  02/2024    2019 and 2025, recall 10 yr   head surgery  Left 2015   BCC removed from Lt side of head- Dermatologist Dr Bard Molt   INGUINAL HERNIA REPAIR Right 2002   KNEE ARTHROSCOPY Left    ROTATOR CUFF REPAIR Left 09/30/2014   thumb extraction Left 05/19/2016   left thumb cut off accident   TRANSTHORACIC ECHOCARDIOGRAM     02/2024 normal    Outpatient Medications Prior to Visit  Medication Sig Dispense Refill   alfuzosin  (UROXATRAL ) 10 MG 24 hr tablet Take 1 tablet (10 mg total) by mouth daily. 90 tablet 3   apixaban  (ELIQUIS ) 5 MG TABS tablet Take 1 tablet (5 mg total) by mouth 2 (two) times daily. 180 tablet 3   Cholecalciferol 50 MCG (2000 UT) TABS Take 1 tablet by mouth daily.     diazepam  (VALIUM ) 5 MG tablet Take 1 tablet (5 mg total) by mouth every 12 (twelve) hours as needed for muscle spasms. 10 tablet 0   diltiazem  (CARDIZEM  CD) 120 MG 24 hr capsule Take 1 capsule (120 mg total) by mouth daily. 90 capsule 3   donepezil  (ARICEPT ) 5 MG tablet Take 1 tablet (5 mg total) by mouth at bedtime. 30 tablet 2   ipratropium (ATROVENT ) 0.03 % nasal spray Place 1-2 sprays into both nostrils 2 (two) times daily as needed (nasal drainage). 30 mL 5   levothyroxine  (SYNTHROID ) 50 MCG tablet Take 1 tablet (50 mcg total) by mouth daily. 90 tablet 3   loratadine (CLARITIN) 10 MG tablet Take 10 mg by mouth daily.     Magnesium  400 MG TABS Take 1 tablet by mouth daily. 90 tablet 3   tiZANidine  (ZANAFLEX ) 4 MG tablet Take 1 tablet (4 mg total) by mouth at bedtime. 90 tablet 1   atorvastatin  (LIPITOR) 40 MG tablet Take 1 tablet (40 mg total) by mouth daily. 90 tablet 3   diltiazem  (CARDIZEM ) 30 MG tablet Take 1 tablet (30 mg total) by mouth as needed for up to 1 dose (persistent palpitations). (Patient not taking: Reported on 08/21/2024) 10 tablet 0   No facility-administered medications prior to visit.    Allergies[1]  Review of Systems As per HPI  PE:    08/21/2024    9:38 AM 08/01/2024    11:17 AM 08/01/2024   11:15 AM  Vitals with BMI  Height   5' 9  Weight 181 lbs 13 oz  175 lbs  BMI 26.83  25.83  Systolic 132 134   Diastolic 75 76   Pulse 53 64      Physical Exam  Gen: Alert, well appearing.  Patient is oriented to person, place, time, and situation. AFFECT: pleasant, lucid thought and speech. Mild discomfort to palpation over the right elbow lateral aspect directly over, extensor tendon. No swelling or erythema.  Range of motion of the elbow is fully intact.  He has increased pain with wrist extension and forearm supination.  LABS:  Last  CBC Lab Results  Component Value Date   WBC 4.1 07/24/2024   HGB 12.9 (L) 07/24/2024   HCT 37.1 (L) 07/24/2024   MCV 102.8 (H) 07/24/2024   MCH 35.7 (H) 07/11/2024   RDW 13.7 07/24/2024   PLT 165.0 07/24/2024   Lab Results  Component Value Date   IRON 152 07/24/2024   TIBC 362 07/24/2024   FERRITIN 68 07/24/2024   Last metabolic panel Lab Results  Component Value Date   GLUCOSE 105 (H) 07/11/2024   NA 139 07/11/2024   K 3.8 07/11/2024   CL 102 07/11/2024   CO2 23 07/11/2024   BUN 15 07/11/2024   CREATININE 0.95 07/11/2024   EGFR 84 07/11/2024   CALCIUM  8.7 07/11/2024   PROT 6.4 07/24/2024   ALBUMIN 4.4 07/24/2024   LABGLOB 2.3 12/27/2023   AGRATIO 1.8 (H) 12/27/2023   BILITOT 1.1 07/24/2024   ALKPHOS 44 07/24/2024   AST 21 07/24/2024   ALT 17 07/24/2024   ANIONGAP 10 02/14/2017   Last lipids Lab Results  Component Value Date   CHOL 146 07/24/2024   HDL 85.10 07/24/2024   LDLCALC 46 07/24/2024   TRIG 73.0 07/24/2024   CHOLHDL 2 07/24/2024   Last hemoglobin A1c Lab Results  Component Value Date   HGBA1C 5.4 07/05/2023   HGBA1C 5.4 07/05/2023   Last thyroid  functions Lab Results  Component Value Date   TSH 2.91 07/24/2024   Last vitamin B12 and Folate Lab Results  Component Value Date   VITAMINB12 255 07/24/2024   FOLATE 10.6 07/24/2024   IMPRESSION AND PLAN:  #1 GAD. Responded  to nortriptyline in the past. Will start nortriptyline 25 mg nightly and recheck in a few weeks.  #2 right elbow common extensor tendinosis. We will give his tennis elbow strap some time.  Emphasized the importance of relative rest. Unfortunately, he has a lot of household jobs to do and trying to get a rental property ready before January. No additional recommendations at this time.  #3 chronic allergic rhinitis. Recommended he restart a nasal steroid like Flonase every day.  Continue daily nonsedating antihistamine.  4.  Hypercholesterolemia.  His lipids have been at goal (he has aortic atherosclerosis) on atorvastatin  20 mg a day.  Most recent LDL was 46 about 1 mo ago. He has leg cramps and wonders if this could be statin related.  He would like to discontinue his statin for now and recheck cholesterol in 6 months  An After Visit Summary was printed and given to the patient.  FOLLOW UP: Return in about 3 weeks (around 09/11/2024). Next CPE November 2026 Signed:  Gerlene Hockey, MD           08/21/2024       [1]  Allergies Allergen Reactions   Latex Itching and Rash   Benadryl [Diphenhydramine Hcl] Other (See Comments)    Paradoxical excitation   Tape Itching and Rash    Paper tape okay

## 2024-08-21 NOTE — Patient Instructions (Signed)
 SABRA

## 2024-08-28 ENCOUNTER — Ambulatory Visit (HOSPITAL_COMMUNITY): Admission: RE | Admit: 2024-08-28 | Discharge: 2024-08-28 | Attending: Internal Medicine | Admitting: Internal Medicine

## 2024-08-28 ENCOUNTER — Encounter (HOSPITAL_COMMUNITY): Payer: Self-pay | Admitting: Internal Medicine

## 2024-08-28 VITALS — BP 112/76 | HR 64 | Ht 69.0 in | Wt 184.4 lb

## 2024-08-28 DIAGNOSIS — I480 Paroxysmal atrial fibrillation: Secondary | ICD-10-CM

## 2024-08-28 NOTE — Progress Notes (Signed)
 Primary Care Physician: Candise Aleene DEL, MD Primary Cardiologist: None Electrophysiologist: Fonda Kitty, MD     Referring Physician: Dr. Kitty Quale AKHIL Marc Benson is a 73 y.o. male with a history of PAC/PVCs, CAD, anxiety, and paroxysmal atrial fibrillation who presents for consultation in the [facility]. Patient is on Eliquis  for stroke prevention.  On patient is currently in NSR. S/p Afib ablation on 07/31/24 by Dr. Kitty. No episodes of Afib since ablation. No chest pain or SOB. Leg sites healed without issue. No missed doses of anticoagulant. He is recovering from tennis elbow related to painting for extending period of time.   Today, he denies symptoms of orthopnea, PND, lower extremity edema, dizziness, presyncope, syncope, snoring, daytime somnolence, bleeding, or neurologic sequela. The patient is tolerating medications without difficulties and is otherwise without complaint today.    he has a BMI of Body mass index is 27.23 kg/m.SABRA Filed Weights   08/28/24 1505  Weight: 83.6 kg    Current Outpatient Medications  Medication Sig Dispense Refill   alfuzosin  (UROXATRAL ) 10 MG 24 hr tablet Take 1 tablet (10 mg total) by mouth daily. 90 tablet 3   apixaban  (ELIQUIS ) 5 MG TABS tablet Take 1 tablet (5 mg total) by mouth 2 (two) times daily. 180 tablet 3   Cholecalciferol 50 MCG (2000 UT) TABS Take 1 tablet by mouth daily.     diazepam  (VALIUM ) 5 MG tablet Take 1 tablet (5 mg total) by mouth every 12 (twelve) hours as needed for muscle spasms. 10 tablet 0   diltiazem  (CARDIZEM  CD) 120 MG 24 hr capsule Take 1 capsule (120 mg total) by mouth daily. 90 capsule 3   diltiazem  (CARDIZEM ) 30 MG tablet Take 1 tablet (30 mg total) by mouth as needed for up to 1 dose (persistent palpitations). 10 tablet 0   donepezil  (ARICEPT ) 5 MG tablet Take 1 tablet (5 mg total) by mouth at bedtime. 30 tablet 2   ipratropium (ATROVENT ) 0.03 % nasal spray Place 1-2 sprays  into both nostrils 2 (two) times daily as needed (nasal drainage). 30 mL 5   levothyroxine  (SYNTHROID ) 50 MCG tablet Take 1 tablet (50 mcg total) by mouth daily. 90 tablet 3   loratadine (CLARITIN) 10 MG tablet Take 10 mg by mouth daily.     Magnesium  400 MG TABS Take 1 tablet by mouth daily. 90 tablet 3   nortriptyline  (PAMELOR ) 25 MG capsule Take 1 capsule (25 mg total) by mouth at bedtime. 30 capsule 0   tiZANidine  (ZANAFLEX ) 4 MG tablet Take 1 tablet (4 mg total) by mouth at bedtime. 90 tablet 1   No current facility-administered medications for this encounter.    Atrial Fibrillation Management history:  Previous antiarrhythmic drugs: None Previous cardioversions: None Previous ablations: PVI x 2 in 2007, 07/31/2024 Anticoagulation history: Eliquis    ROS- All systems are reviewed and negative except as per the HPI above.  Physical Exam: BP 112/76   Pulse 64   Ht 5' 9 (1.753 m)   Wt 83.6 kg   BMI 27.23 kg/m   GEN: Well nourished, well developed in no acute distress NECK: No JVD; No carotid bruits CARDIAC: Regular rate and rhythm, no murmurs, rubs, gallops RESPIRATORY:  Clear to auscultation without rales, wheezing or rhonchi  ABDOMEN: Soft, non-tender, non-distended EXTREMITIES:  No edema; No deformity   EKG today demonstrates  EKG Interpretation Date/Time:  Thursday August 28 2024 15:07:48 EST Ventricular Rate:  64 PR  Interval:  154 QRS Duration:  96 QT Interval:  420 QTC Calculation: 433 R Axis:   94  Text Interpretation: Normal sinus rhythm Rightward axis Septal infarct , age undetermined Abnormal ECG When compared with ECG of 01-Aug-2024 11:18, PREVIOUS ECG IS PRESENT Confirmed by Terra Pac ([id number]) on 08/28/2024 3:10:54 PM    Echo 05/23/2024 demonstrated   1. Left ventricular ejection fraction, by estimation, is 55 to 60%. The  left ventricle has normal function. The left ventricle has no regional  wall motion abnormalities. Left ventricular diastolic  parameters were  normal.   2. Right ventricular systolic function is normal. The right ventricular  size is normal. There is normal pulmonary artery systolic pressure. The  estimated right ventricular systolic pressure is 20.6 mmHg.   3. Left atrial size was moderately dilated.   4. Right atrial size was mildly dilated.   5. The mitral valve is normal in structure. Trivial mitral valve  regurgitation.   6. The aortic valve is tricuspid. There is mild calcification of the  aortic valve. There is mild thickening of the aortic valve. Aortic valve  regurgitation is not visualized. Aortic valve sclerosis/calcification is  present, without any evidence of  aortic stenosis.   7. The inferior vena cava is normal in size with greater than 50%  respiratory variability, suggesting right atrial pressure of 3 mmHg.   ASSESSMENT & PLAN CHA2DS2-VASc Score = 2  The patient's score is based upon: CHF History: 0 HTN History: 0 Diabetes History: 0 Stroke History: 0 Vascular Disease History: 1 Age Score: 1 Gender Score: 0       ASSESSMENT AND PLAN: Paroxysmal Atrial Fibrillation (ICD10:  I48.0) The patient's CHA2DS2-VASc score is 2, indicating a 2.2% annual risk of stroke.   S/p A-fib ablation on 07/31/2024 by Dr. Kennyth.  Patient is currently in NSR.  Continue diltiazem  120 mg daily.   Secondary Hypercoagulable State (ICD10:  D68.69) The patient is at significant risk for stroke/thromboembolism based upon his CHA2DS2-VASc Score of 2.  Continue Apixaban  (Eliquis ).  Continue Eliquis  5 mg BID without interruption.      Follow up with EP as scheduled.   Terra Pac, [facility]  Afib Clinic [address] [place], [place] [address] [phone]

## 2024-09-09 ENCOUNTER — Telehealth (HOSPITAL_BASED_OUTPATIENT_CLINIC_OR_DEPARTMENT_OTHER): Payer: Self-pay | Admitting: *Deleted

## 2024-09-09 NOTE — Telephone Encounter (Signed)
"  ° °  Patient Name: Marc Benson  DOB: 1951/06/25 MRN: [id number]  Primary Cardiologist: None  Chart reviewed as part of pre-operative protocol coverage. Cataract extractions are recognized in guidelines as low risk surgeries that do not typically require specific preoperative testing or holding of blood thinner therapy. Therefore, given past medical history and time since last visit, based on ACC/AHA guidelines, Marc Benson would be at acceptable risk for the planned procedure without further cardiovascular testing.   I will route this recommendation to the requesting party via Epic fax function and remove from pre-op  pool.  Please call with questions.  Marc LOISE Fabry, PA-C 09/09/2024, 3:45 PM  "

## 2024-09-09 NOTE — Telephone Encounter (Signed)
"  ° °  Pre-operative Risk Assessment    Patient Name: Marc Benson  DOB: Aug 26, 1951 MRN: [id number]   Date of last office visit: 08/28/24 A-FIB CLINIC; 05/13/24 DR. PARKER Date of next office visit: 10/31/24 Marc BARRACK, NP   Request for Surgical Clearance    Procedure:  CATARACT EXTRACTION WITH INTRAOCULAR LENS IMPLANT OF THE RIGHT EYE TO BE DONE ON 09/23/24; THE LEFT EYE WILL FOLLOW TO BE DONE ON 10/07/24  Date of Surgery:  Clearance 09/23/24                                Surgeon: NOT LISTED Surgeon's Group or Practice Name:  [facility]  Phone number:  [phone] Fax number:  [phone]   Type of Clearance Requested:   - Medical  PER FORM THE PT DOES NOT NEED TO HOLD ANY MEDICATIONS   Type of Anesthesia:  TOPICAL WITH IV MEDICATIONS   Additional requests/questions:    Signed, Marc Benson   09/09/2024, 3:40 PM   "

## 2024-09-10 ENCOUNTER — Other Ambulatory Visit: Payer: Self-pay | Admitting: *Deleted

## 2024-09-10 NOTE — Telephone Encounter (Signed)
 Rx was already filled by Dr.McGowen

## 2024-09-12 ENCOUNTER — Ambulatory Visit: Admitting: Family Medicine

## 2024-09-12 ENCOUNTER — Other Ambulatory Visit: Payer: Self-pay | Admitting: Family Medicine

## 2024-09-12 MED ORDER — NORTRIPTYLINE HCL 25 MG PO CAPS: 25.0000 mg | ORAL_CAPSULE | Freq: Every day | ORAL | 0 refills | Status: DC

## 2024-09-15 ENCOUNTER — Other Ambulatory Visit: Payer: Self-pay | Admitting: Family Medicine

## 2024-09-15 ENCOUNTER — Other Ambulatory Visit: Payer: Self-pay

## 2024-09-15 MED ORDER — NORTRIPTYLINE HCL 25 MG PO CAPS: 25.0000 mg | ORAL_CAPSULE | Freq: Every day | ORAL | 0 refills | Status: DC

## 2024-09-15 MED ORDER — LEVOTHYROXINE SODIUM 50 MCG PO TABS: 50.0000 ug | ORAL_TABLET | Freq: Every day | ORAL | 2 refills | Status: AC

## 2024-09-15 NOTE — Telephone Encounter (Signed)
 Copied from CRM [id number]. Topic: Clinical - Medication Refill >> Sep 15, 2024  9:52 AM Carlyon D wrote: Medication:  levothyroxine  (SYNTHROID ) 50 MCG tablet   nortriptyline  (PAMELOR ) 25 MG capsule   Has the patient contacted their pharmacy? Yes (Agent: If no, request that the patient contact the pharmacy for the refill. If patient does not wish to contact the pharmacy document the reason why and proceed with request.) (Agent: If yes, when and what did the pharmacy advise?)  This is the patient's preferred pharmacy:  [id number] - [place], [place] - [address] AT [facility] [address] [place] [address] Phone: [phone] Fax: [phone]  [facility] - [place], [place] - [address] [place] [address] Phone: [phone] Fax: [phone]  [email or web address] [facility] - [place], [place] - [address] [place] [address] Phone: [phone] Fax: [phone]  Is this the correct pharmacy for this prescription? Yes If no, delete pharmacy and type the correct one.   Has the prescription been filled recently? Yes  Is the patient out of the medication? No but will be has 6 on one and 2 on the other   Has the patient been seen for an appointment in the last year OR does the patient have an upcoming appointment? Yes  Can we respond through MyChart? Yes  Agent: Please be advised that Rx refills may take up to 3 business days. We ask that you follow-up with your pharmacy.

## 2024-09-23 HISTORY — PX: CATARACT EXTRACTION: SUR2

## 2024-09-28 ENCOUNTER — Other Ambulatory Visit: Payer: Self-pay | Admitting: Family Medicine

## 2024-10-01 ENCOUNTER — Encounter: Payer: Self-pay | Admitting: Family Medicine

## 2024-10-01 ENCOUNTER — Ambulatory Visit: Admitting: Family Medicine

## 2024-10-01 VITALS — BP 111/69 | HR 63 | Temp 96.2°F | Ht 69.0 in | Wt 180.8 lb

## 2024-10-01 DIAGNOSIS — F411 Generalized anxiety disorder: Secondary | ICD-10-CM

## 2024-10-01 DIAGNOSIS — E7800 Pure hypercholesterolemia, unspecified: Secondary | ICD-10-CM | POA: Diagnosis not present

## 2024-10-01 DIAGNOSIS — G3184 Mild cognitive impairment, so stated: Secondary | ICD-10-CM

## 2024-10-01 DIAGNOSIS — T50905D Adverse effect of unspecified drugs, medicaments and biological substances, subsequent encounter: Secondary | ICD-10-CM

## 2024-10-01 MED ORDER — NORTRIPTYLINE HCL 25 MG PO CAPS: 25.0000 mg | ORAL_CAPSULE | Freq: Every day | ORAL | 3 refills | Status: DC

## 2024-10-01 MED ORDER — ROSUVASTATIN CALCIUM 5 MG PO TABS: 5.0000 mg | ORAL_TABLET | Freq: Every day | ORAL | 3 refills | Status: AC

## 2024-10-01 NOTE — Progress Notes (Signed)
 OFFICE VISIT  10/01/2024  CC:  Chief Complaint  Patient presents with   Medical Management of Chronic Issues    Patient is a 74 y.o. male who presents for 6-week follow-up GAD and suspected statin side effects. A/P as of last visit: #1 GAD. Responded to nortriptyline  in the past. Will start nortriptyline  25 mg nightly and recheck in a few weeks.   #2 right elbow common extensor tendinosis. We will give his tennis elbow strap some time.  Emphasized the importance of relative rest. Unfortunately, he has a lot of household jobs to do and trying to get a rental property ready before January. No additional recommendations at this time.   #3 chronic allergic rhinitis. Recommended he restart a nasal steroid like Flonase every day.  Continue daily nonsedating antihistamine.   4.  Hypercholesterolemia.  His lipids have been at goal (he has aortic atherosclerosis) on atorvastatin  20 mg a day.  Most recent LDL was 46 about 1 mo ago. He has leg cramps and wonders if this could be statin related.  He would like to discontinue his statin for now and recheck cholesterol in 6 months  INTERIM HX: Anxiety is better.  He is currently happy with the 25 mg nortriptyline  dose. He says that ever since he stopped atorvastatin  his muscle cramps have gone completely away and his sleep is good.  He says he has tolerated rosuvastatin  in the past (for 20 years or so).  He does not know why he got off of it and why he was put on atorvastatin .  ROS: no headaches, no dizziness, no rash, no swelling, no palpitations, no shortness of breath, no chest pain, no arthralgias or myalgias.  Past Medical History:  Diagnosis Date   ANXIETY 05/14/2009   Aortic atherosclerosis    Bilateral chronic knee pain    L lateral meniscus tear on MRI 03/2024, +tricomp arth   BPH with lower urinary tract symptoms without urinary obstruction    CAD (coronary artery disease)    Nonobtructive.  elev cor ca score.   Diverticulosis     GERD (gastroesophageal reflux disease)    Hepatic steatosis    ultrasound 2025   Hypercholesterolemia    Hypothyroidism    Insomnia    Mild cognitive impairment    Chronic lacunar infarct in the right corona radiata.  Moderate degree of chronic small vessel ischemic changes   Osteoarthritis, multiple sites    PAROXYSMAL ATRIAL FIBRILLATION 05/14/2009   s/p PVI x 2 [facility]   PREMATURE VENTRICULAR CONTRACTIONS 05/14/2009    Past Surgical History:  Procedure Laterality Date   ABLATION     a-fib-->multiple-->most recent 11/202/25   ATRIAL FIBRILLATION ABLATION N/A 07/31/2024   Procedure: ATRIAL FIBRILLATION ABLATION;  Surgeon: Kennyth Chew, MD;  Location: [facility];  Service: Cardiovascular;  Laterality: N/A;   CARDIAC CATHETERIZATION  11/18/2004   CATARACT EXTRACTION Right 09/23/2024   COLONOSCOPY  02/2024   2019 and 2025, recall 10 yr   head surgery  Left 2015   BCC removed from Lt side of head- Dermatologist Dr Bard Molt   INGUINAL HERNIA REPAIR Right 2002   KNEE ARTHROSCOPY Left    ROTATOR CUFF REPAIR Left 09/30/2014   thumb extraction Left 05/19/2016   left thumb cut off accident   TRANSTHORACIC ECHOCARDIOGRAM     02/2024 normal    Outpatient Medications Prior to Visit  Medication Sig Dispense Refill   alfuzosin  (UROXATRAL ) 10 MG 24 hr tablet Take 1 tablet (10 mg total) by  mouth daily. 90 tablet 3   apixaban  (ELIQUIS ) 5 MG TABS tablet Take 1 tablet (5 mg total) by mouth 2 (two) times daily. 180 tablet 3   Cholecalciferol 50 MCG (2000 UT) TABS Take 1 tablet by mouth daily.     diltiazem  (CARDIZEM  CD) 120 MG 24 hr capsule Take 1 capsule (120 mg total) by mouth daily. 90 capsule 3   donepezil  (ARICEPT ) 5 MG tablet Take 1 tablet (5 mg total) by mouth at bedtime. 30 tablet 2   gatifloxacin (ZYMAXID) 0.5 % SOLN Place 1 drop into the right eye 3 (three) times daily.     ipratropium (ATROVENT ) 0.03 % nasal spray Place 1-2 sprays into both nostrils 2 (two) times daily  as needed (nasal drainage). 30 mL 5   levothyroxine  (SYNTHROID ) 50 MCG tablet Take 1 tablet (50 mcg total) by mouth daily. 90 tablet 2   loratadine (CLARITIN) 10 MG tablet Take 10 mg by mouth daily.     Magnesium  400 MG TABS Take 1 tablet by mouth daily. 90 tablet 3   nortriptyline  (PAMELOR ) 25 MG capsule Take 1 capsule (25 mg total) by mouth at bedtime. 30 capsule 0   tiZANidine  (ZANAFLEX ) 4 MG tablet Take 1 tablet (4 mg total) by mouth at bedtime. 90 tablet 1   diazepam  (VALIUM ) 5 MG tablet Take 1 tablet (5 mg total) by mouth every 12 (twelve) hours as needed for muscle spasms. 10 tablet 0   diltiazem  (CARDIZEM ) 30 MG tablet Take 1 tablet (30 mg total) by mouth as needed for up to 1 dose (persistent palpitations). 10 tablet 0   No facility-administered medications prior to visit.    Allergies[1]  Review of Systems As per HPI  PE:    10/01/2024    1:23 PM 08/28/2024    3:05 PM 08/21/2024    9:38 AM  Vitals with BMI  Height 5' 9 5' 9   Weight 180 lbs 13 oz 184 lbs 6 oz 181 lbs 13 oz  BMI 26.69 27.22 26.83  Systolic 111 112 867  Diastolic 69 76 75  Pulse 63 64 53     Physical Exam  General: Alert and well-appearing. Affect is pleasant, thought and speech are lucid. No further exam today  LABS:  Last metabolic panel Lab Results  Component Value Date   GLUCOSE 105 (H) 07/11/2024   NA 139 07/11/2024   K 3.8 07/11/2024   CL 102 07/11/2024   CO2 23 07/11/2024   BUN 15 07/11/2024   CREATININE 0.95 07/11/2024   EGFR 84 07/11/2024   CALCIUM  8.7 07/11/2024   PROT 6.4 07/24/2024   ALBUMIN 4.4 07/24/2024   LABGLOB 2.3 12/27/2023   AGRATIO 1.8 (H) 12/27/2023   BILITOT 1.1 07/24/2024   ALKPHOS 44 07/24/2024   AST 21 07/24/2024   ALT 17 07/24/2024   ANIONGAP 10 02/14/2017   Last lipids Lab Results  Component Value Date   CHOL 146 07/24/2024   HDL 85.10 07/24/2024   LDLCALC 46 07/24/2024   TRIG 73.0 07/24/2024   CHOLHDL 2 07/24/2024   Lab Results  Component  Value Date   TSH 2.91 07/24/2024   IMPRESSION AND PLAN:  #1 GAD, doing well now on nortriptyline  25 mg nightly.  2.  Medication side effect: Myalgias and insomnia due to atorvastatin . He recalls tolerating rosuvastatin  well in the past. Will start rosuvastatin  5 mg a day today and we will recheck in 6 months.  #3 hypercholesterolemia. Goal LDL less than 70. His LDL was  46 back in November 2025 when he was on atorvastatin . Starting rosuvastatin  5 mg daily today.  #4 mild cognitive impairment. This is the problem he is most frustrated about. For about the last 18 months he has noted tendency to lose his train of thought when speaking, has to have his wife finish his sentences a lot. He had a complete neuro eval for this, all reassuring.  He was started on Aricept  5 mg but he is not really sure if this is helping or not. He has follow-up with neurology in a couple of months.  An After Visit Summary was printed and given to the patient.  FOLLOW UP: Return in about 6 months (around 03/31/2025) for routine chronic illness f/u. Next CPE November 2026 Signed:  Gerlene Hockey, MD           10/01/2024     [1]  Allergies Allergen Reactions   Latex Itching and Rash   Benadryl [Diphenhydramine Hcl] Other (See Comments)    Paradoxical excitation   Tape Itching and Rash    Paper tape okay

## 2024-10-03 ENCOUNTER — Telehealth: Payer: Self-pay | Admitting: Cardiology

## 2024-10-03 NOTE — Telephone Encounter (Signed)
" °*  STAT* If patient is at the pharmacy, call can be transferred to refill team.   1. Which medications need to be refilled? (please list name of each medication and dose if known)   apixaban  (ELIQUIS ) 5 MG TABS tablet    2. Would you like to learn more about the convenience, safety, & potential cost savings by using the [facility]? no   3. Are you open to using the [facility] (Type [facility]. no   4. Which pharmacy/location (including street and city if local pharmacy) is medication to be sent to?   [email or web address] - [facility] - [place] - [address]   5. Do they need a 30 day or 90 day supply? 90 day     Pt is out.  "

## 2024-10-07 MED ORDER — APIXABAN 5 MG PO TABS: 5.0000 mg | ORAL_TABLET | Freq: Two times a day (BID) | ORAL | 1 refills | Status: AC

## 2024-10-07 NOTE — Telephone Encounter (Signed)
 Prescription refill request for Eliquis  received. Indication: AFIB Last office visit: 06/2024 Scr:  0.95 10/31 Age: 74 Weight: 82 KG  Refill for Eliqiuis sent to [facility].

## 2024-10-09 ENCOUNTER — Other Ambulatory Visit: Payer: Self-pay

## 2024-10-09 MED ORDER — NORTRIPTYLINE HCL 25 MG PO CAPS: 25.0000 mg | ORAL_CAPSULE | Freq: Every day | ORAL | 3 refills | Status: AC

## 2024-10-31 ENCOUNTER — Ambulatory Visit: Admitting: Pulmonary Disease

## 2024-11-10 ENCOUNTER — Ambulatory Visit: Admitting: Neurology

## 2024-11-12 ENCOUNTER — Ambulatory Visit
